# Patient Record
Sex: Female | Born: 1940
Health system: Southern US, Community
[De-identification: ages and names within clinical notes are randomized; demographics above are authoritative.]

## PROBLEM LIST (undated history)

## (undated) DIAGNOSIS — E119 Type 2 diabetes mellitus without complications: Secondary | ICD-10-CM

## (undated) DIAGNOSIS — E785 Hyperlipidemia, unspecified: Secondary | ICD-10-CM

## (undated) DIAGNOSIS — S92353A Displaced fracture of fifth metatarsal bone, unspecified foot, initial encounter for closed fracture: Secondary | ICD-10-CM

## (undated) DIAGNOSIS — I10 Essential (primary) hypertension: Secondary | ICD-10-CM

## (undated) DIAGNOSIS — G4700 Insomnia, unspecified: Secondary | ICD-10-CM

## (undated) HISTORY — DX: Hyperlipidemia, unspecified: E78.5

## (undated) HISTORY — DX: Type 2 diabetes mellitus without complications: E11.9

## (undated) HISTORY — DX: Essential (primary) hypertension: I10

## (undated) HISTORY — DX: Displaced fracture of fifth metatarsal bone, unspecified foot, initial encounter for closed fracture: S92.353A

## (undated) HISTORY — DX: Insomnia, unspecified: G47.00

---

## 2011-07-02 ENCOUNTER — Encounter: Payer: Self-pay | Admitting: Family Medicine

## 2011-07-02 DIAGNOSIS — Z8639 Personal history of other endocrine, nutritional and metabolic disease: Secondary | ICD-10-CM | POA: Insufficient documentation

## 2011-07-02 DIAGNOSIS — I10 Essential (primary) hypertension: Secondary | ICD-10-CM | POA: Insufficient documentation

## 2011-07-02 DIAGNOSIS — E785 Hyperlipidemia, unspecified: Secondary | ICD-10-CM

## 2011-07-03 ENCOUNTER — Encounter: Payer: Self-pay | Admitting: Family Medicine

## 2011-07-03 ENCOUNTER — Ambulatory Visit (INDEPENDENT_AMBULATORY_CARE_PROVIDER_SITE_OTHER): Payer: Medicare Other | Admitting: Family Medicine

## 2011-07-03 VITALS — BP 114/75 | HR 66 | Temp 98.4°F | Ht 60.0 in | Wt 147.0 lb

## 2011-07-03 DIAGNOSIS — S92919A Unspecified fracture of unspecified toe(s), initial encounter for closed fracture: Secondary | ICD-10-CM

## 2011-07-03 DIAGNOSIS — S92911A Unspecified fracture of right toe(s), initial encounter for closed fracture: Secondary | ICD-10-CM

## 2011-07-03 NOTE — Patient Instructions (Signed)
You have a great toe fracture. This should heal up well in 4-6 weeks with conservative care. Wear the boot or post-op shoe when you're up and walking around at all times. Ok to take off to ice your toe, to bathe (as long as you don't put weight on the ball of your foot or toe - be careful!), and to sleep if it feels comfortable enough. Elevate above the level of your heart to help with swelling. Ice 15 minutes at a time up to 3-4 times a day for pain and swelling. Follow up with me in 2 weeks for a recheck and to repeat x-rays and make sure this hasn't moved.

## 2011-07-04 ENCOUNTER — Encounter: Payer: Self-pay | Admitting: Family Medicine

## 2011-07-04 DIAGNOSIS — S92911A Unspecified fracture of right toe(s), initial encounter for closed fracture: Secondary | ICD-10-CM | POA: Insufficient documentation

## 2011-07-04 NOTE — Assessment & Plan Note (Signed)
Nondisplaced distal phalanx fracture of great toe - should heal well with conservative treatment over next 4-6 weeks.  Given this is the great toe, will have to repeat x-rays at 2 week intervals until clinically healed.  Continue icing, elevation, tylenol as needed for pain.  She did not feel comfortable in the postop shoe she has - feels too loose though tried to tighten this.  We tried her in walking boots and our postop shoe - she felt our postop shoe felt better and fracture site with less pain on ambulation in this.  F/u in 2 weeks for repeat eval and x-rays.

## 2011-07-04 NOTE — Progress Notes (Signed)
Subjective:    Patient ID: Diana Garcia, female    DOB: 1941-04-12, 70 y.o.   MRN: [id number]  PCP: Dr. Modesto Charon  HPI 70 yo F here with right great toe fracture.  Patient reports on 7/30 she was walking around at home when she caught foot in a plastic bag and fell forward causing injury to right great toe. Had immediate swelling and bruising, difficulty bearing weight. She saw her PCP on 8/1, had x-rays showing a nondisplaced metaphyseal fracture at base of distal phalanx of great toe. She was placed in a post-op shoe, advised to ice and elevate and referred here for further recommendations. Has h/o 5th MT fracture of this foot that occurred in March 2012.  Past Medical History  Diagnosis Date  . Closed fracture of fifth metatarsal bone   . Hyperlipidemia   . Hypertension   . Insomnia     Current Outpatient Prescriptions on File Prior to Visit  Medication Sig Dispense Refill  . amLODipine (NORVASC) 10 MG tablet Take 10 mg by mouth daily.        . Cholecalciferol (VITAMIN D PO) Take 2,000 Int'l Units by mouth daily.        . pravastatin (PRAVACHOL) 40 MG tablet Take 40 mg by mouth at bedtime.          History reviewed. No pertinent past surgical history.  Allergies  Allergen Reactions  . Penicillins     History   Social History  . Marital Status: Married    Spouse Name: N/A    Number of Children: N/A  . Years of Education: N/A   Occupational History  . Not on file.   Social History Main Topics  . Smoking status: Never Smoker   . Smokeless tobacco: Not on file  . Alcohol Use: Not on file  . Drug Use: Not on file  . Sexually Active: Not on file   Other Topics Concern  . Not on file   Social History Narrative  . No narrative on file    Family History  Problem Relation Age of Onset  . Hyperlipidemia Mother   . Hypertension Mother   . Diabetes Father   . Heart attack Father   . Diabetes Sister   . Hypertension Sister   . Diabetes Brother   . Sudden death  Neg Hx     BP 114/75  Pulse 66  Temp(Src) 98.4 F (36.9 C) (Oral)  Ht 5' (1.524 m)  Wt 147 lb (66.679 kg)  BMI 28.71 kg/m2  Review of Systems See HPI above.    Objective:   Physical Exam Gen: NAD  R foot: Bruising and swelling throughout great toe.  No bruising apparent under toenail - wearing polish through 3/4 of nail.  No breaks in skin. TTP distal phalanx of great toe.  No proximal phalanx, foot TTP otherwise. FROM ankle and other toes.  Did not test extent of great toe IP ROM - can flex and extend at MTP. Cap refill < 2 sec great toe.     Assessment & Plan:  1. Nondisplaced distal phalanx fracture of great toe - should heal well with conservative treatment over next 4-6 weeks.  Given this is the great toe, will have to repeat x-rays at 2 week intervals until clinically healed.  Continue icing, elevation, tylenol as needed for pain.  She did not feel comfortable in the postop shoe she has - feels too loose though tried to tighten this.  We tried her in walking  boots and our postop shoe - she felt our postop shoe felt better and fracture site with less pain on ambulation in this.  F/u in 2 weeks for repeat eval and x-rays.

## 2011-07-09 ENCOUNTER — Other Ambulatory Visit: Payer: Self-pay | Admitting: Family Medicine

## 2011-07-09 DIAGNOSIS — Z780 Asymptomatic menopausal state: Secondary | ICD-10-CM

## 2011-07-10 ENCOUNTER — Other Ambulatory Visit: Payer: Self-pay | Admitting: Family Medicine

## 2011-07-10 DIAGNOSIS — Z1231 Encounter for screening mammogram for malignant neoplasm of breast: Secondary | ICD-10-CM

## 2011-07-16 ENCOUNTER — Other Ambulatory Visit: Payer: PRIVATE HEALTH INSURANCE

## 2011-07-17 ENCOUNTER — Encounter: Payer: Self-pay | Admitting: Family Medicine

## 2011-07-17 ENCOUNTER — Ambulatory Visit (INDEPENDENT_AMBULATORY_CARE_PROVIDER_SITE_OTHER): Payer: Medicare Other | Admitting: Family Medicine

## 2011-07-17 ENCOUNTER — Ambulatory Visit (HOSPITAL_BASED_OUTPATIENT_CLINIC_OR_DEPARTMENT_OTHER)
Admission: RE | Admit: 2011-07-17 | Discharge: 2011-07-17 | Disposition: A | Discharge status: Home / Self Care | Payer: Medicare Other | Source: Ambulatory Visit | Attending: Family Medicine | Admitting: Family Medicine

## 2011-07-17 VITALS — BP 122/77 | HR 59

## 2011-07-17 DIAGNOSIS — M79676 Pain in unspecified toe(s): Secondary | ICD-10-CM

## 2011-07-17 DIAGNOSIS — S92919A Unspecified fracture of unspecified toe(s), initial encounter for closed fracture: Secondary | ICD-10-CM

## 2011-07-17 DIAGNOSIS — M79609 Pain in unspecified limb: Secondary | ICD-10-CM

## 2011-07-17 DIAGNOSIS — S92911A Unspecified fracture of right toe(s), initial encounter for closed fracture: Secondary | ICD-10-CM

## 2011-07-17 DIAGNOSIS — IMO0001 Reserved for inherently not codable concepts without codable children: Secondary | ICD-10-CM

## 2011-07-17 DIAGNOSIS — Z4789 Encounter for other orthopedic aftercare: Secondary | ICD-10-CM | POA: Insufficient documentation

## 2011-07-17 NOTE — Progress Notes (Signed)
Subjective:    Patient ID: Diana Garcia, female    DOB: 1941/03/08, 70 y.o.   MRN: [id number]  PCP: Dr. Modesto Charon  HPI  70 yo F here with right great toe fracture.  8/3: Patient reports on 7/30 she was walking around at home when she caught foot in a plastic bag and fell forward causing injury to right great toe. Had immediate swelling and bruising, difficulty bearing weight. She saw her PCP on 8/1, had x-rays showing a nondisplaced metaphyseal fracture at base of distal phalanx of great toe. She was placed in a post-op shoe, advised to ice and elevate and referred here for further recommendations. Has h/o 5th MT fracture of this foot that occurred in March 2012.  8/17: Patient reports pain is much improved, now very little pain in great toe. Not taking any medications. Using post-op shoe regularly. No other complaints.  Past Medical History  Diagnosis Date  . Closed fracture of fifth metatarsal bone   . Hyperlipidemia   . Hypertension   . Insomnia     Current Outpatient Prescriptions on File Prior to Visit  Medication Sig Dispense Refill  . amLODipine (NORVASC) 10 MG tablet Take 10 mg by mouth daily.        . Cholecalciferol (VITAMIN D PO) Take 2,000 Int'l Units by mouth daily.        . furosemide (LASIX) 40 MG tablet       . pravastatin (PRAVACHOL) 40 MG tablet Take 40 mg by mouth at bedtime.        Marland Kitchen zolpidem (AMBIEN) 5 MG tablet         History reviewed. No pertinent past surgical history.  Allergies  Allergen Reactions  . Penicillins     History   Social History  . Marital Status: Married    Spouse Name: N/A    Number of Children: N/A  . Years of Education: N/A   Occupational History  . Not on file.   Social History Main Topics  . Smoking status: Never Smoker   . Smokeless tobacco: Not on file  . Alcohol Use: Not on file  . Drug Use: Not on file  . Sexually Active: Not on file   Other Topics Concern  . Not on file   Social History Narrative  . No  narrative on file    Family History  Problem Relation Age of Onset  . Hyperlipidemia Mother   . Hypertension Mother   . Diabetes Father   . Heart attack Father   . Diabetes Sister   . Hypertension Sister   . Diabetes Brother   . Sudden death Neg Hx     BP 122/77  Pulse 59  Review of Systems  See HPI above.    Objective:   Physical Exam  Gen: NAD  R foot: Only mild swelling throughout great toe.  No bruising, no subungual hematoma Mild TTP distal phalanx of great toe.  No proximal phalanx, foot TTP otherwise. FROM ankle and other toes.  Did not test extent of great toe IP ROM - can flex and extend at MTP. Cap refill < 2 sec great toe.     Assessment & Plan:  1. Nondisplaced distal phalanx fracture of great toe - X-rays show no displacement.  Clinically healing as expected.  Will f/u in 3 weeks for repeat exam.  If still tender will repeat x-rays at that time.  Tylenol, icing as needed.  Continue postop shoe.  Call with any concerns in meantime.

## 2011-07-17 NOTE — Patient Instructions (Signed)
Your x-rays look great. Continue wearing the post-op shoe when up and walking around. Icing, tylenol as needed for pain. This will be completely healed by 4 weeks from now. Follow up with me in 2-3 weeks for a recheck - if still sore, we may repeat x-rays. Call with any questions or concerns.

## 2011-07-17 NOTE — Assessment & Plan Note (Signed)
Nondisplaced distal phalanx fracture of great toe - X-rays show no displacement.  Clinically healing as expected.  Will f/u in 3 weeks for repeat exam.  If still tender will repeat x-rays at that time.  Tylenol, icing as needed.  Continue postop shoe.  Call with any concerns in meantime.

## 2011-08-06 ENCOUNTER — Ambulatory Visit (HOSPITAL_BASED_OUTPATIENT_CLINIC_OR_DEPARTMENT_OTHER)
Admission: RE | Admit: 2011-08-06 | Discharge: 2011-08-06 | Disposition: A | Discharge status: Home / Self Care | Payer: Medicare Other | Source: Ambulatory Visit | Attending: Family Medicine | Admitting: Family Medicine

## 2011-08-06 ENCOUNTER — Ambulatory Visit (INDEPENDENT_AMBULATORY_CARE_PROVIDER_SITE_OTHER): Payer: PRIVATE HEALTH INSURANCE | Admitting: Family Medicine

## 2011-08-06 ENCOUNTER — Encounter: Payer: Self-pay | Admitting: Family Medicine

## 2011-08-06 VITALS — BP 115/75

## 2011-08-06 DIAGNOSIS — X58XXXA Exposure to other specified factors, initial encounter: Secondary | ICD-10-CM | POA: Insufficient documentation

## 2011-08-06 DIAGNOSIS — S92919A Unspecified fracture of unspecified toe(s), initial encounter for closed fracture: Secondary | ICD-10-CM

## 2011-08-06 DIAGNOSIS — M79671 Pain in right foot: Secondary | ICD-10-CM

## 2011-08-06 DIAGNOSIS — S92911A Unspecified fracture of right toe(s), initial encounter for closed fracture: Secondary | ICD-10-CM

## 2011-08-06 DIAGNOSIS — M79609 Pain in unspecified limb: Secondary | ICD-10-CM | POA: Insufficient documentation

## 2011-08-06 NOTE — Patient Instructions (Signed)
Your x-rays show this fracture on your 5th metatarsal has healed though you do have mild arthritis here. Wear the shoe for 2 more weeks then you can stop using it. Ice, naprosyn as needed. Follow up with me as needed.

## 2011-08-06 NOTE — Progress Notes (Signed)
Subjective:    Patient ID: Diana Garcia, female    DOB: 1941-09-01, 70 y.o.   MRN: [id number]  PCP: Dr. Modesto Charon  Foot Pain   71 yo F here with right great toe fracture.  8/3: Patient reports on 7/30 she was walking around at home when she caught foot in a plastic bag and fell forward causing injury to right great toe. Had immediate swelling and bruising, difficulty bearing weight. She saw her PCP on 8/1, had x-rays showing a nondisplaced metaphyseal fracture at base of distal phalanx of great toe. She was placed in a post-op shoe, advised to ice and elevate and referred here for further recommendations. Has h/o 5th MT fracture of this foot that occurred in March 2012.  8/17: Patient reports pain is much improved, now very little pain in great toe. Not taking any medications. Using post-op shoe regularly. No other complaints.  9/6: Patient doing better still - 40% improved compared to last visit. Only has pain at extents of full motion of great toe. No pain when she pushes on area. Did take naprosyn the other day at bedtime because it was more sore. Using postop shoe regularly. Outside of right foot hurts occasionally - she had a fracture here previously but was told it would heal well and didn't need follow-up. Not icing - is elevating.  Past Medical History  Diagnosis Date  . Closed fracture of fifth metatarsal bone   . Hyperlipidemia   . Hypertension   . Insomnia     Current Outpatient Prescriptions on File Prior to Visit  Medication Sig Dispense Refill  . amLODipine (NORVASC) 10 MG tablet Take 10 mg by mouth daily.        . Cholecalciferol (VITAMIN D PO) Take 2,000 Int'l Units by mouth daily.        . furosemide (LASIX) 40 MG tablet       . pravastatin (PRAVACHOL) 40 MG tablet Take 40 mg by mouth at bedtime.        Marland Kitchen zolpidem (AMBIEN) 5 MG tablet         History reviewed. No pertinent past surgical history.  Allergies  Allergen Reactions  . Penicillins      History   Social History  . Marital Status: Married    Spouse Name: N/A    Number of Children: N/A  . Years of Education: N/A   Occupational History  . Not on file.   Social History Main Topics  . Smoking status: Never Smoker   . Smokeless tobacco: Not on file  . Alcohol Use: Not on file  . Drug Use: Not on file  . Sexually Active: Not on file   Other Topics Concern  . Not on file   Social History Narrative  . No narrative on file    Family History  Problem Relation Age of Onset  . Hyperlipidemia Mother   . Hypertension Mother   . Diabetes Father   . Heart attack Father   . Diabetes Sister   . Hypertension Sister   . Diabetes Brother   . Sudden death Neg Hx     BP 115/75  Review of Systems  See HPI above.    Objective:   Physical Exam Gen: NAD  R foot: No swelling, bruising, no subungual hematoma No TTP distal phalanx of great toe.  Mild TTP just proximal to 5th MT base.  No focal bony TTP.  No other foot TTP otherwise. FROM ankle, toes including MTP and IP great  toe. Negative ant drawer and talar tilt. Cap refill < 2 sec great toe.    Assessment & Plan:  1. Nondisplaced distal phalanx fracture of great toe - X-rays show no displacement - fracture lines visible though clinically now has no pain.  Discussed radiographs can lag behind clinical healing by 2-4 weeks and this fracture generally does very well.  Continue postop shoe, icing, elevation for next 2 weeks then postop shoe as needed.  F/u prn or if not improving as expected over next 2-4 weeks.  Also noted her lateral foot pain, though not at 5th MT currently, may be due to arthritis near site of avulsion fracture she had of base 5th.  Fracture itself appears healed though.

## 2011-08-06 NOTE — Assessment & Plan Note (Signed)
Nondisplaced distal phalanx fracture of great toe - X-rays show no displacement - fracture lines visible though clinically now has no pain.  Discussed radiographs can lag behind clinical healing by 2-4 weeks and this fracture generally does very well.  Continue postop shoe, icing, elevation for next 2 weeks then postop shoe as needed.  F/u prn or if not improving as expected over next 2-4 weeks.  Also noted her lateral foot pain, though not at 5th MT currently, may be due to arthritis near site of avulsion fracture she had of base 5th.  Fracture itself appears healed though.

## 2011-08-07 ENCOUNTER — Ambulatory Visit: Payer: PRIVATE HEALTH INSURANCE | Admitting: Family Medicine

## 2011-08-25 ENCOUNTER — Ambulatory Visit
Admission: RE | Admit: 2011-08-25 | Discharge: 2011-08-25 | Disposition: A | Discharge status: Home / Self Care | Payer: PRIVATE HEALTH INSURANCE | Source: Ambulatory Visit | Attending: Family Medicine | Admitting: Family Medicine

## 2011-08-25 ENCOUNTER — Ambulatory Visit
Admission: RE | Admit: 2011-08-25 | Discharge: 2011-08-25 | Disposition: A | Discharge status: Home / Self Care | Payer: Medicare Other | Source: Ambulatory Visit | Attending: Family Medicine | Admitting: Family Medicine

## 2011-08-25 DIAGNOSIS — Z1231 Encounter for screening mammogram for malignant neoplasm of breast: Secondary | ICD-10-CM

## 2011-08-25 DIAGNOSIS — Z780 Asymptomatic menopausal state: Secondary | ICD-10-CM

## 2012-01-13 DIAGNOSIS — R7989 Other specified abnormal findings of blood chemistry: Secondary | ICD-10-CM | POA: Diagnosis not present

## 2012-01-13 DIAGNOSIS — I10 Essential (primary) hypertension: Secondary | ICD-10-CM | POA: Diagnosis not present

## 2012-01-13 DIAGNOSIS — E559 Vitamin D deficiency, unspecified: Secondary | ICD-10-CM | POA: Diagnosis not present

## 2012-01-13 DIAGNOSIS — E785 Hyperlipidemia, unspecified: Secondary | ICD-10-CM | POA: Diagnosis not present

## 2012-01-18 DIAGNOSIS — N900 Mild vulvar dysplasia: Secondary | ICD-10-CM | POA: Diagnosis not present

## 2012-04-20 ENCOUNTER — Ambulatory Visit (HOSPITAL_COMMUNITY)
Admission: RE | Admit: 2012-04-20 | Discharge: 2012-04-20 | Disposition: A | Discharge status: Home / Self Care | Payer: Medicare Other | Source: Ambulatory Visit | Attending: Family Medicine | Admitting: Family Medicine

## 2012-04-20 DIAGNOSIS — E7800 Pure hypercholesterolemia, unspecified: Secondary | ICD-10-CM | POA: Diagnosis not present

## 2012-04-20 DIAGNOSIS — R52 Pain, unspecified: Secondary | ICD-10-CM

## 2012-04-20 DIAGNOSIS — M79609 Pain in unspecified limb: Secondary | ICD-10-CM | POA: Diagnosis not present

## 2012-04-20 DIAGNOSIS — E785 Hyperlipidemia, unspecified: Secondary | ICD-10-CM | POA: Diagnosis not present

## 2012-04-20 DIAGNOSIS — IMO0001 Reserved for inherently not codable concepts without codable children: Secondary | ICD-10-CM | POA: Diagnosis not present

## 2012-04-20 DIAGNOSIS — E559 Vitamin D deficiency, unspecified: Secondary | ICD-10-CM | POA: Diagnosis not present

## 2012-04-20 DIAGNOSIS — R9431 Abnormal electrocardiogram [ECG] [EKG]: Secondary | ICD-10-CM | POA: Diagnosis not present

## 2012-04-20 DIAGNOSIS — I10 Essential (primary) hypertension: Secondary | ICD-10-CM | POA: Diagnosis not present

## 2012-04-20 DIAGNOSIS — I70219 Atherosclerosis of native arteries of extremities with intermittent claudication, unspecified extremity: Secondary | ICD-10-CM | POA: Diagnosis not present

## 2012-04-20 DIAGNOSIS — R7989 Other specified abnormal findings of blood chemistry: Secondary | ICD-10-CM | POA: Diagnosis not present

## 2012-04-20 DIAGNOSIS — M7989 Other specified soft tissue disorders: Secondary | ICD-10-CM | POA: Diagnosis not present

## 2012-04-20 NOTE — Progress Notes (Signed)
Bilateral lower extremity venous duplex completed.  Preliminary report is negative for DVT, SVT, or a Baker's cyst. 

## 2012-04-21 ENCOUNTER — Ambulatory Visit (HOSPITAL_COMMUNITY)
Admission: RE | Admit: 2012-04-21 | Discharge: 2012-04-21 | Disposition: A | Discharge status: Home / Self Care | Payer: Medicare Other | Source: Ambulatory Visit | Attending: Family Medicine | Admitting: Family Medicine

## 2012-04-21 DIAGNOSIS — M79609 Pain in unspecified limb: Secondary | ICD-10-CM | POA: Diagnosis not present

## 2012-04-21 DIAGNOSIS — M79606 Pain in leg, unspecified: Secondary | ICD-10-CM

## 2012-04-21 DIAGNOSIS — I739 Peripheral vascular disease, unspecified: Secondary | ICD-10-CM | POA: Diagnosis not present

## 2012-04-21 DIAGNOSIS — I70219 Atherosclerosis of native arteries of extremities with intermittent claudication, unspecified extremity: Secondary | ICD-10-CM | POA: Insufficient documentation

## 2012-04-21 DIAGNOSIS — R0989 Other specified symptoms and signs involving the circulatory and respiratory systems: Secondary | ICD-10-CM | POA: Diagnosis not present

## 2012-04-21 NOTE — Progress Notes (Signed)
VASCULAR LAB PRELIMINARY  ARTERIAL  ABI completed:    RIGHT    LEFT    PRESSURE WAVEFORM  PRESSURE WAVEFORM  BRACHIAL 142  triphasic BRACHIAL 146 triphasic  DP 164 triphasic DP 67 monophasic  AT   AT    PT 136 triphasic PT 91 monophasic  PER   PER    GREAT TOE  NA GREAT TOE  NA    RIGHT LEFT  ABI 1.12 0.62   Duplex imaging reveals significant stenosis in the left mid to distal femoral artery.  Terance Hart, RVT 04/21/2012, 12:42 PM

## 2012-04-22 DIAGNOSIS — I10 Essential (primary) hypertension: Secondary | ICD-10-CM | POA: Diagnosis not present

## 2012-04-22 DIAGNOSIS — E7800 Pure hypercholesterolemia, unspecified: Secondary | ICD-10-CM | POA: Diagnosis not present

## 2012-04-22 DIAGNOSIS — R9431 Abnormal electrocardiogram [ECG] [EKG]: Secondary | ICD-10-CM | POA: Diagnosis not present

## 2012-04-27 DIAGNOSIS — R9431 Abnormal electrocardiogram [ECG] [EKG]: Secondary | ICD-10-CM | POA: Diagnosis not present

## 2012-04-27 DIAGNOSIS — I10 Essential (primary) hypertension: Secondary | ICD-10-CM | POA: Diagnosis not present

## 2012-05-04 DIAGNOSIS — R9431 Abnormal electrocardiogram [ECG] [EKG]: Secondary | ICD-10-CM | POA: Diagnosis not present

## 2012-05-04 DIAGNOSIS — E7800 Pure hypercholesterolemia, unspecified: Secondary | ICD-10-CM | POA: Diagnosis not present

## 2012-05-04 DIAGNOSIS — I70219 Atherosclerosis of native arteries of extremities with intermittent claudication, unspecified extremity: Secondary | ICD-10-CM | POA: Diagnosis not present

## 2012-05-04 DIAGNOSIS — I10 Essential (primary) hypertension: Secondary | ICD-10-CM | POA: Diagnosis not present

## 2012-05-05 ENCOUNTER — Encounter (HOSPITAL_COMMUNITY): Payer: Self-pay | Admitting: Pharmacy Technician

## 2012-05-10 DIAGNOSIS — I739 Peripheral vascular disease, unspecified: Secondary | ICD-10-CM | POA: Diagnosis not present

## 2012-05-10 DIAGNOSIS — I70219 Atherosclerosis of native arteries of extremities with intermittent claudication, unspecified extremity: Secondary | ICD-10-CM | POA: Diagnosis not present

## 2012-05-10 DIAGNOSIS — Z01810 Encounter for preprocedural cardiovascular examination: Secondary | ICD-10-CM | POA: Diagnosis not present

## 2012-05-16 ENCOUNTER — Observation Stay (HOSPITAL_COMMUNITY)
Admission: RE | Admit: 2012-05-16 | Discharge: 2012-05-17 | Disposition: A | Discharge status: Home / Self Care | Payer: Medicare Other | Source: Ambulatory Visit | Attending: Cardiology | Admitting: Cardiology

## 2012-05-16 ENCOUNTER — Encounter (HOSPITAL_COMMUNITY)
Admission: RE | Disposition: A | Discharge status: Home / Self Care | Payer: Self-pay | Source: Ambulatory Visit | Attending: Cardiology

## 2012-05-16 DIAGNOSIS — E785 Hyperlipidemia, unspecified: Secondary | ICD-10-CM | POA: Diagnosis not present

## 2012-05-16 DIAGNOSIS — I739 Peripheral vascular disease, unspecified: Secondary | ICD-10-CM | POA: Diagnosis present

## 2012-05-16 DIAGNOSIS — I70219 Atherosclerosis of native arteries of extremities with intermittent claudication, unspecified extremity: Principal | ICD-10-CM | POA: Insufficient documentation

## 2012-05-16 DIAGNOSIS — R9431 Abnormal electrocardiogram [ECG] [EKG]: Secondary | ICD-10-CM | POA: Diagnosis not present

## 2012-05-16 DIAGNOSIS — I10 Essential (primary) hypertension: Secondary | ICD-10-CM | POA: Insufficient documentation

## 2012-05-16 HISTORY — PX: LOWER EXTREMITY ANGIOGRAM: SHX5508

## 2012-05-16 LAB — POCT ACTIVATED CLOTTING TIME: Activated Clotting Time: 244 seconds

## 2012-05-16 LAB — POCT I-STAT, CHEM 8
Creatinine, Ser: 0.7 mg/dL (ref 0.50–1.10)
Glucose, Bld: 132 mg/dL — ABNORMAL HIGH (ref 70–99)
Hemoglobin: 12.9 g/dL (ref 12.0–15.0)
Potassium: 4.2 mEq/L (ref 3.5–5.1)

## 2012-05-16 LAB — GLUCOSE, CAPILLARY: Glucose-Capillary: 154 mg/dL — ABNORMAL HIGH (ref 70–99)

## 2012-05-16 SURGERY — ANGIOGRAM, LOWER EXTREMITY
Anesthesia: LOCAL

## 2012-05-16 MED ORDER — ATROPINE SULFATE 1 MG/ML IJ SOLN
INTRAMUSCULAR | Status: AC
  Filled 2012-05-16: qty 1

## 2012-05-16 MED ORDER — CARVEDILOL 3.125 MG PO TABS
3.1250 mg | ORAL_TABLET | Freq: Two times a day (BID) | ORAL | Status: DC
  Administered 2012-05-16: 3.125 mg via ORAL
  Filled 2012-05-16 (×4): qty 1

## 2012-05-16 MED ORDER — NITROGLYCERIN 0.2 MG/ML ON CALL CATH LAB
INTRAVENOUS | Status: AC
  Filled 2012-05-16: qty 1

## 2012-05-16 MED ORDER — HYDROMORPHONE HCL PF 2 MG/ML IJ SOLN
INTRAMUSCULAR | Status: AC
  Filled 2012-05-16: qty 1

## 2012-05-16 MED ORDER — ONDANSETRON HCL 4 MG/2ML IJ SOLN: 4.0000 mg | Freq: Four times a day (QID) | INTRAMUSCULAR | Status: DC | PRN

## 2012-05-16 MED ORDER — LIDOCAINE HCL (PF) 1 % IJ SOLN
3.0000 mL | Freq: Once | INTRAMUSCULAR | Status: AC
  Administered 2012-05-16: 1 mL

## 2012-05-16 MED ORDER — MIDAZOLAM HCL 2 MG/2ML IJ SOLN
INTRAMUSCULAR | Status: AC
  Filled 2012-05-16: qty 2

## 2012-05-16 MED ORDER — SODIUM CHLORIDE 0.9 % IV SOLN: 1.0000 mL/kg/h | INTRAVENOUS | Status: DC

## 2012-05-16 MED ORDER — ASPIRIN EC 81 MG PO TBEC
81.0000 mg | DELAYED_RELEASE_TABLET | Freq: Every day | ORAL | Status: DC
  Administered 2012-05-17: 81 mg via ORAL
  Filled 2012-05-16 (×2): qty 1

## 2012-05-16 MED ORDER — ACETAMINOPHEN 325 MG PO TABS
650.0000 mg | ORAL_TABLET | ORAL | Status: DC | PRN
  Filled 2012-05-16: qty 2

## 2012-05-16 MED ORDER — HEPARIN SODIUM (PORCINE) 1000 UNIT/ML IJ SOLN
INTRAMUSCULAR | Status: AC
  Filled 2012-05-16: qty 1

## 2012-05-16 MED ORDER — HEPARIN (PORCINE) IN NACL 2-0.9 UNIT/ML-% IJ SOLN
INTRAMUSCULAR | Status: AC
  Filled 2012-05-16: qty 2000

## 2012-05-16 MED ORDER — LISINOPRIL 10 MG PO TABS
10.0000 mg | ORAL_TABLET | Freq: Every day | ORAL | Status: DC
  Administered 2012-05-16: 10 mg via ORAL
  Filled 2012-05-16 (×2): qty 1

## 2012-05-16 MED ORDER — CLOPIDOGREL BISULFATE 75 MG PO TABS
75.0000 mg | ORAL_TABLET | Freq: Every day | ORAL | Status: DC
  Administered 2012-05-17: 75 mg via ORAL
  Filled 2012-05-16: qty 1

## 2012-05-16 MED ORDER — ACETAMINOPHEN 325 MG PO TABS
650.0000 mg | ORAL_TABLET | ORAL | Status: DC | PRN
  Administered 2012-05-17: 650 mg via ORAL

## 2012-05-16 MED ORDER — SODIUM CHLORIDE 0.9 % IV SOLN
INTRAVENOUS | Status: DC
  Administered 2012-05-16: 06:00:00 via INTRAVENOUS

## 2012-05-16 MED ORDER — LIDOCAINE-EPINEPHRINE 1 %-1:100000 IJ SOLN
INTRAMUSCULAR | Status: AC
  Filled 2012-05-16: qty 1

## 2012-05-16 MED ORDER — EZETIMIBE-SIMVASTATIN 10-40 MG PO TABS
1.0000 | ORAL_TABLET | Freq: Every day | ORAL | Status: DC
  Administered 2012-05-16: 1 via ORAL
  Filled 2012-05-16 (×2): qty 1

## 2012-05-16 MED ORDER — AMLODIPINE BESYLATE 10 MG PO TABS
10.0000 mg | ORAL_TABLET | Freq: Every day | ORAL | Status: DC
  Administered 2012-05-17: 10 mg via ORAL
  Filled 2012-05-16: qty 1

## 2012-05-16 MED ORDER — LIDOCAINE HCL (PF) 1 % IJ SOLN
INTRAMUSCULAR | Status: AC
  Filled 2012-05-16: qty 30

## 2012-05-16 NOTE — Progress Notes (Signed)
C/O RIGHT GROIN PAIN AND STATES IT HAD NOT BEEN HURTING; AREA FIRMNESS NOTED LATERALLY TO GROIN STICK AND DRESSING REMOVED AND OOZING NOTED RIGHT GROIN AND DR Jacinto Halim NOTIFIED AND ORDER NOTED

## 2012-05-16 NOTE — CV Procedure (Addendum)
Procedures performed: Femoral access  Abdominal aortogram. Abdominal aortogram and crossover from right into the left femoral artery placement of catheter tip in the left femoral artery and left femoral arteriogram with distal runoff  Indication: Is a 72 year old African American female with history of hypertension and hyperlipidemia who presents with severe lifestyle limiting claudication of the left leg. Outpatient Doppler evaluation had revealed high-grade stenosis of the left superficial femoral artery. She is now brought to the peripheral angiography suite for evaluation of her peripheral anatomy.  Peripheral arthrogram: No evidence of abdominal aneurysm. 2 renal arteries one on either side and they're widely patent. The right renal artery has a inferior origin. Aortoiliac bifurcation was widely patent.   Left femoral arteriogram: Normal femoral arteries and mild disease in the proximal left SFA constituting 20-30% stenoses. Midsegment of the left superficial femoral artery showed high-grade 99% stenosis. Slow filling of the peripheral vessels. There was two-vessel runoff in the form of peroneal and posterior tibial artery. Distal anterior tibial artery is occluded and is collateralized.  Interventional data: Successful angiosculpt angioscore balloon angioplasty of the left mid superficial femoral artery stenoses. A 6.0 x 40 mm angioscore balloon was utilized to perform a total of 3 balloon inflation and 4-6 atmospheric pressure for 90 seconds each. Intra-arterial 200 mcg x2 of nitroglycerin also administered during the procedure. The stenosis was reduced from 99% and with brisk flow established distally below the knee. Excellent pulses were evident postangioplasty.  IMPRESSIONS: Single lesion in the left mid superficial femoral artery of 99% reduced to 0% with scoring balloon angioplasty. Left anterior tibial artery is occluded just above the ankle but reconstitutes via collaterals. Excellent  dorsalis pedis pulse was also admitted post procedure.  Recommendation: Patient will be discharged home today after 7 hours of bedrest. She'll be in bedrest pretty much at home today and skin slowly resume her activities tomorrow.   TECHNICAL PROCEDURE: Under sterile precautions using a 5-French right femoral access a A 5 [place] Omniflush catheter was advanced via right femoral arterial access. The catheter was advanced into the desccending aorta and abdominal angiogram was performed. The same catheter was utilized to cross over from the right femoral artery into the left femoral artery with the help of a Versacore wire in the catheter was placed in the left external iliac artery. Left femoral arteriogram with distal runoff was performed.  Intravenous heparin was utilized and ACT was maintained and there is greater than 200. A 300 cm x 0.014"spartocore  Guidewire was utilized to cross the left superficial femoral artery stenoses. Angina score was performed using the above said balloon. Angiography was performed postprocedure. The lesion length was measured with an of radiopaque marker.  After the procedure the wire was withdrawn and a 7 [place] sheath that was exchanged from a 5 [place] sheath was then gently pulled back into the right common femoral artery and right femoral arteriogram was performed. Close proximity to the bifurcation of the common femoral artery we decided to do manual pressure. The angioplasty was performed via 7 French Ansel sheath. Patient told the procedure well. No immediate complications were evident. Abdominal aortogram: This was performed with the help of a 5 French Omni Flush catheter.

## 2012-05-16 NOTE — Discharge Instructions (Signed)
Yout will be discharged home today after 7 hours of bedrest. She'll be in bedrest pretty much at home today and skin slowly resume her activities tomorrow.Groin Site Care Refer to this sheet in the next few weeks. These instructions provide you with information on caring for yourself after your procedure. Your caregiver may also give you more specific instructions. Your treatment has been planned according to current medical practices, but problems sometimes occur. Call your caregiver if you have any problems or questions after your procedure. HOME CARE INSTRUCTIONS  You may shower 24 hours after the procedure. Remove the bandage (dressing) and gently wash the site with plain soap and water. Gently pat the site dry.   Do not apply powder or lotion to the site.   Do not sit in a bathtub, swimming pool, or whirlpool for 5 to 7 days.   No bending, squatting, or lifting anything over 10 pounds (4.5 kg) as directed by your caregiver.   Inspect the site at least twice daily.   Do not drive home if you are discharged the same day of the procedure. Have someone else drive you.   You may drive 24 hours after the procedure unless otherwise instructed by your caregiver.  What to expect:  Any bruising will usually fade within 1 to 2 weeks.   Blood that collects in the tissue (hematoma) may be painful to the touch. It should usually decrease in size and tenderness within 1 to 2 weeks.  SEEK IMMEDIATE MEDICAL CARE IF:  You have unusual pain at the groin site or down the affected leg.   You have redness, warmth, swelling, or pain at the groin site.   You have drainage (other than a small amount of blood on the dressing).   You have chills.   You have a fever or persistent symptoms for more than 72 hours.   You have a fever and your symptoms suddenly get worse.   Your leg becomes pale, cool, tingly, or numb.   You have heavy bleeding from the site. Hold pressure on the site.  Document  Released: 12/19/2010 Document Revised: 11/05/2011 Document Reviewed: 12/19/2010 [facility] Patient Information 2012 [employer], [place].

## 2012-05-16 NOTE — Interval H&P Note (Signed)
History and Physical Interval Note:  05/16/2012 7:42 AM  Diana Garcia  has presented today for surgery, with the diagnosis of claudication  The various methods of treatment have been discussed with the patient and family. After consideration of risks, benefits and other options for treatment, the patient has consented to  Procedure(s) (LRB): LOWER EXTREMITY ANGIOGRAM (N/A) and possible angioplasty as a surgical intervention .  The patients' history has been reviewed, patient examined, no change in status, stable for surgery.  I have reviewed the patients' chart and labs.  Questions were answered to the patient's satisfaction.     Pamella Pert

## 2012-05-16 NOTE — H&P (Signed)
  Please see paper chart  

## 2012-05-17 ENCOUNTER — Encounter (HOSPITAL_COMMUNITY): Payer: Self-pay | Admitting: *Deleted

## 2012-05-17 LAB — CBC
HCT: 36.3 % (ref 36.0–46.0)
MCHC: 31.4 g/dL (ref 30.0–36.0)
Platelets: 164 10*3/uL (ref 150–400)
RDW: 13.6 % (ref 11.5–15.5)
WBC: 7.8 10*3/uL (ref 4.0–10.5)

## 2012-05-17 LAB — COMPREHENSIVE METABOLIC PANEL
ALT: 13 U/L (ref 0–35)
Albumin: 3.6 g/dL (ref 3.5–5.2)
Alkaline Phosphatase: 66 U/L (ref 39–117)
Calcium: 9.3 mg/dL (ref 8.4–10.5)
GFR calc Af Amer: >90 mL/min (ref >90)
Glucose, Bld: 118 mg/dL — ABNORMAL HIGH (ref 70–99)
Potassium: 4.1 mEq/L (ref 3.5–5.1)
Sodium: 140 mEq/L (ref 135–145)
Total Protein: 6.5 g/dL (ref 6.0–8.3)

## 2012-05-17 MED ORDER — ZOLPIDEM TARTRATE 5 MG PO TABS
5.0000 mg | ORAL_TABLET | Freq: Every evening | ORAL | Status: DC | PRN
  Administered 2012-05-17: 5 mg via ORAL
  Filled 2012-05-17: qty 1

## 2012-05-17 NOTE — Progress Notes (Signed)
Utilization review complete 

## 2012-05-17 NOTE — Discharge Summary (Signed)
  Physician Discharge Summary  Patient ID: Diana Garcia MRN: [id number] DOB/AGE: 07/18/1941 71 y.o.  Admit date: 05/16/2012 Discharge date: 05/17/2012  Primary Discharge Diagnosis peripheral arterial disease status post PTA and scoring balloon angioplasty of the left superficial femoral artery. Stenosis reduced from 99% to 0%. Left anterior tibial artery is occluded but with excellent collateral filling at the level of the ankle.  Secondary Discharge Diagnosis Hypertension Hyperlipidemia  Significant Diagnostic Studies:  Consults:   Hospital Course: Patient was admitted to the hospital an elective fashion for peripheral angiography for  claudication. She did well during the procedure however post procedure she developed a small hematoma. Due to her advanced age, petite built, patient was kept in the hospital for overnight observation. The following morning the right groin site was soft without any significant hematoma or bruit. There was no tenderness. Hence patient was felt stable for discharge.  Discharge Exam: Blood pressure 99/57, pulse 60, temperature 97.7 F (36.5 C), temperature source Oral, resp. rate 19, height 5\' 2"  (1.575 m), weight 64.864 kg (143 lb), SpO2 99.00%.    General appearance: alert, appears stated age and no distress Resp: clear to auscultation bilaterally Cardio: regular rate and rhythm, S1, S2 normal, no murmur, click, rub or gallop Extremities: extremities normal, atraumatic, no cyanosis or edema and See above for groin site description Labs:   Lab Results  Component Value Date   WBC 7.8 05/17/2012   HGB 11.4* 05/17/2012   HCT 36.3 05/17/2012   MCV 96.5 05/17/2012   PLT 164 05/17/2012    Lab 05/17/12 0510  NA 140  K 4.1  CL 107  CO2 24  BUN 13  CREATININE 0.69  CALCIUM 9.3  PROT 6.5  BILITOT 0.3  ALKPHOS 66  ALT 13  AST 14  GLUCOSE 118*     WUJ:WJXB: NSR  FOLLOW UP PLANS AND APPOINTMENTS  Medication List  As of 05/17/2012  7:56 AM   TAKE  these medications         amLODipine 10 MG tablet   Commonly known as: NORVASC   Take 10 mg by mouth daily.      aspirin EC 81 MG tablet   Take 81 mg by mouth daily.      CALCIUM 600 + D PO   Take 1 tablet by mouth 3 (three) times a week. No specific days      carvedilol 3.125 MG tablet   Commonly known as: COREG   Take 3.125 mg by mouth 2 (two) times daily with a meal.      clopidogrel 75 MG tablet   Commonly known as: PLAVIX   Take 75 mg by mouth daily.      ezetimibe-simvastatin 10-40 MG per tablet   Commonly known as: VYTORIN   Take 1 tablet by mouth at bedtime.      lisinopril 10 MG tablet   Commonly known as: PRINIVIL,ZESTRIL   Take 10 mg by mouth daily.      VITAMIN D PO   Take 4,000 Units by mouth 3 (three) times a week. No specific days           Follow-up Information    Follow up with Pamella Pert, MD. (Keep appointment)    Contact information:   [address]  [place] [address] [phone]           Pamella Pert, MD 05/17/2012, 7:56 AM

## 2012-05-30 DIAGNOSIS — I739 Peripheral vascular disease, unspecified: Secondary | ICD-10-CM | POA: Diagnosis not present

## 2012-05-30 DIAGNOSIS — I70219 Atherosclerosis of native arteries of extremities with intermittent claudication, unspecified extremity: Secondary | ICD-10-CM | POA: Diagnosis not present

## 2012-06-08 DIAGNOSIS — E7800 Pure hypercholesterolemia, unspecified: Secondary | ICD-10-CM | POA: Diagnosis not present

## 2012-06-08 DIAGNOSIS — R7309 Other abnormal glucose: Secondary | ICD-10-CM | POA: Diagnosis not present

## 2012-06-08 DIAGNOSIS — I70219 Atherosclerosis of native arteries of extremities with intermittent claudication, unspecified extremity: Secondary | ICD-10-CM | POA: Diagnosis not present

## 2012-06-08 DIAGNOSIS — I10 Essential (primary) hypertension: Secondary | ICD-10-CM | POA: Diagnosis not present

## 2012-06-09 DIAGNOSIS — E119 Type 2 diabetes mellitus without complications: Secondary | ICD-10-CM | POA: Diagnosis not present

## 2012-06-09 DIAGNOSIS — I10 Essential (primary) hypertension: Secondary | ICD-10-CM | POA: Diagnosis not present

## 2012-07-28 DIAGNOSIS — I10 Essential (primary) hypertension: Secondary | ICD-10-CM | POA: Diagnosis not present

## 2012-07-28 DIAGNOSIS — IMO0001 Reserved for inherently not codable concepts without codable children: Secondary | ICD-10-CM | POA: Diagnosis not present

## 2012-08-04 DIAGNOSIS — E119 Type 2 diabetes mellitus without complications: Secondary | ICD-10-CM | POA: Diagnosis not present

## 2012-08-04 DIAGNOSIS — I10 Essential (primary) hypertension: Secondary | ICD-10-CM | POA: Diagnosis not present

## 2012-11-02 DIAGNOSIS — E119 Type 2 diabetes mellitus without complications: Secondary | ICD-10-CM | POA: Diagnosis not present

## 2012-11-02 DIAGNOSIS — E785 Hyperlipidemia, unspecified: Secondary | ICD-10-CM | POA: Diagnosis not present

## 2012-11-16 ENCOUNTER — Other Ambulatory Visit: Payer: Self-pay | Admitting: Family Medicine

## 2012-11-16 DIAGNOSIS — Z1231 Encounter for screening mammogram for malignant neoplasm of breast: Secondary | ICD-10-CM

## 2012-12-20 ENCOUNTER — Ambulatory Visit: Payer: Medicare Other

## 2012-12-26 DIAGNOSIS — I70219 Atherosclerosis of native arteries of extremities with intermittent claudication, unspecified extremity: Secondary | ICD-10-CM | POA: Diagnosis not present

## 2012-12-27 ENCOUNTER — Ambulatory Visit
Admission: RE | Admit: 2012-12-27 | Discharge: 2012-12-27 | Disposition: A | Discharge status: Home / Self Care | Payer: Medicare Other | Source: Ambulatory Visit | Attending: Family Medicine | Admitting: Family Medicine

## 2012-12-27 DIAGNOSIS — Z1231 Encounter for screening mammogram for malignant neoplasm of breast: Secondary | ICD-10-CM

## 2013-03-16 ENCOUNTER — Encounter: Payer: Self-pay | Admitting: Family Medicine

## 2013-03-16 ENCOUNTER — Ambulatory Visit (INDEPENDENT_AMBULATORY_CARE_PROVIDER_SITE_OTHER): Payer: Medicare Other | Admitting: Family Medicine

## 2013-03-16 VITALS — BP 127/73 | HR 64 | Temp 97.9°F | Ht 63.0 in | Wt 143.4 lb

## 2013-03-16 DIAGNOSIS — I10 Essential (primary) hypertension: Secondary | ICD-10-CM | POA: Diagnosis not present

## 2013-03-16 DIAGNOSIS — E785 Hyperlipidemia, unspecified: Secondary | ICD-10-CM | POA: Diagnosis not present

## 2013-03-16 DIAGNOSIS — E119 Type 2 diabetes mellitus without complications: Secondary | ICD-10-CM | POA: Diagnosis not present

## 2013-03-16 LAB — COMPLETE METABOLIC PANEL WITH GFR
ALT: 10 U/L (ref 0–35)
AST: 14 U/L (ref 0–37)
Albumin: 4.5 g/dL (ref 3.5–5.2)
Alkaline Phosphatase: 58 U/L (ref 39–117)
BUN: 17 mg/dL (ref 6–23)
CO2: 24 mEq/L (ref 19–32)
Calcium: 9.8 mg/dL (ref 8.4–10.5)
Chloride: 104 mEq/L (ref 96–112)
Creat: 0.79 mg/dL (ref 0.50–1.10)
GFR, Est African American: 87 mL/min
GFR, Est Non African American: 76 mL/min
Glucose, Bld: 78 mg/dL (ref 70–99)
Potassium: 3.9 mEq/L (ref 3.5–5.3)
Sodium: 138 mEq/L (ref 135–145)
Total Bilirubin: 0.3 mg/dL (ref 0.3–1.2)
Total Protein: 6.9 g/dL (ref 6.0–8.3)

## 2013-03-16 LAB — POCT GLYCOSYLATED HEMOGLOBIN (HGB A1C): Hemoglobin A1C: 5.8

## 2013-03-16 MED ORDER — CARVEDILOL 3.125 MG PO TABS: 3.1250 mg | ORAL_TABLET | Freq: Two times a day (BID) | ORAL | Status: DC

## 2013-03-16 MED ORDER — ZOLPIDEM TARTRATE 10 MG PO TABS: 10.0000 mg | ORAL_TABLET | Freq: Every evening | ORAL | Status: DC | PRN

## 2013-03-16 MED ORDER — EZETIMIBE-SIMVASTATIN 10-40 MG PO TABS: 1.0000 | ORAL_TABLET | Freq: Every day | ORAL | Status: DC

## 2013-03-16 MED ORDER — AMLODIPINE BESYLATE 10 MG PO TABS: 10.0000 mg | ORAL_TABLET | Freq: Every day | ORAL | Status: DC

## 2013-03-16 MED ORDER — CLOPIDOGREL BISULFATE 75 MG PO TABS: 75.0000 mg | ORAL_TABLET | Freq: Every day | ORAL | Status: DC

## 2013-03-16 MED ORDER — METFORMIN HCL 500 MG PO TABS: 500.0000 mg | ORAL_TABLET | Freq: Two times a day (BID) | ORAL | Status: DC

## 2013-03-17 LAB — NMR LIPOPROFILE WITH LIPIDS
Cholesterol, Total: 133 mg/dL (ref <200)
HDL Particle Number: 29.4 umol/L — ABNORMAL LOW (ref >=30.5)
HDL Size: 9.6 nm (ref >=9.2)
HDL-C: 41 mg/dL (ref >=40)
LDL (calc): 71 mg/dL (ref <100)
LDL Particle Number: 1262 nmol/L — ABNORMAL HIGH (ref <1000)
LDL Size: 19.9 nm — ABNORMAL LOW (ref >20.5)
LP-IR Score: 36 (ref <=45)
Large HDL-P: 8 umol/L (ref >=4.8)
Large VLDL-P: 1.6 nmol/L (ref <=2.7)
Small LDL Particle Number: 891 nmol/L — ABNORMAL HIGH (ref <=527)
Triglycerides: 106 mg/dL (ref <150)
VLDL Size: 48.4 nm — ABNORMAL HIGH (ref <=46.6)

## 2013-03-17 NOTE — Progress Notes (Signed)
Quick Note:  Lab result at goal. No change in Medications for now. No Change in plans and follow up. Await the urine test. ______

## 2013-03-17 NOTE — Progress Notes (Signed)
Patient ID: Diana Garcia, female   DOB: [birth year], 72 y.o.   MRN: [id number] SUBJECTIVE: HPI: Patient known to me from [facility] and [facility]. Came for continued Primary care. Patient is here for follow up of Diabetes Mellitus, Hypertension/ Hyperlipidemia..Symptoms of DM:has had no Nocturia ,deniesUrinary Frequency ,denies Blurred vision ,deniesDizziness,denies.Dysuria,deniesparesthesias, deniesextremity pain or ulcers .denieschest pain. .has had an annual eye exam. do check the feet. doescheck CBGs. Average CBG:________.Marland Kitchen deniesto episodes of hypoglycemia. doeshave an emergency hypoglycemic plan. admits toCompliance with medications. deniesProblems with medications.   PMH/PSH: reviewed/updated in Epic  SH/FH: reviewed/updated in Epic  Allergies: reviewed/updated in Epic  Medications: reviewed/updated in Epic  Immunizations: reviewed/updated in Epic  ROS: As above in the HPI. All other systems are stable or negative.  OBJECTIVE: APPEARANCE:  Patient in no acute distress.The patient appeared well nourished and normally developed. Acyanotic. Waist: VITAL SIGNS:  SKIN: warm and  Dry without overt rashes, tattoos and scars  HEAD and Neck: without JVD, Head and scalp: normal Eyes:No scleral icterus. Fundi normal, eye movements normal. Ears: Auricle normal, canal normal, Tympanic membranes normal, insufflation normal. Nose: normal Throat: normal Neck & thyroid: normal  CHEST & LUNGS: Chest wall: normal Lungs: Clear  CVS: Reveals the PMI to be normally located. Regular rhythm, First and Second Heart sounds are normal,  absence of murmurs, rubs or gallops. Peripheral vasculature: Radial pulses: normal Dorsal pedis pulses: normal Posterior pulses: normal  ABDOMEN:  Appearance: normal Benign,, no organomegaly, no masses, no Abdominal Aortic enlargement. No Guarding , no rebound. No Bruits. Bowel sounds: normal  RECTAL: N/A GU:  N/A  EXTREMETIES: nonedematous. Both Femoral and Pedal pulses are normal.  MUSCULOSKELETAL:  Spine: normal Joints: intact  NEUROLOGIC: oriented to time,place and person; nonfocal. Strength is normal Sensory is normal Reflexes are normal Cranial Nerves are normal.  ASSESSMENT: HTN (hypertension) - Plan: COMPLETE METABOLIC PANEL WITH GFR  HLD (hyperlipidemia) - Plan: COMPLETE METABOLIC PANEL WITH GFR, NMR Lipoprofile with Lipids  Type II or unspecified type diabetes mellitus without mention of complication, not stated as uncontrolled - Plan: POCT glycosylated hemoglobin (Hb A1C), POCT UA - Microalbumin  Insomnia, secondary to the death of a woman who was like a daughter to her. PLAN:  Orders Placed This Encounter  Procedures  . COMPLETE METABOLIC PANEL WITH GFR  . NMR Lipoprofile with Lipids  . POCT glycosylated hemoglobin (Hb A1C)  . POCT UA - Microalbumin   Results for orders placed in visit on 03/16/13 (from the past 24 hour(s))  POCT GLYCOSYLATED HEMOGLOBIN (HGB A1C)     Status: None   Collection Time    03/16/13  5:06 PM      Result Value Range   Hemoglobin A1C 5.8     Meds ordered this encounter  Medications  . DISCONTD: metFORMIN (GLUCOPHAGE) 500 MG tablet    Sig: Take 500 mg by mouth 2 (two) times daily with a meal.   . metFORMIN (GLUCOPHAGE) 500 MG tablet    Sig: Take 1 tablet (500 mg total) by mouth 2 (two) times daily with a meal.    Dispense:  180 tablet    Refill:  3  . ezetimibe-simvastatin (VYTORIN) 10-40 MG per tablet    Sig: Take 1 tablet by mouth at bedtime.    Dispense:  90 tablet    Refill:  3  . clopidogrel (PLAVIX) 75 MG tablet    Sig: Take 1 tablet (75 mg total) by mouth daily.    Dispense:  90 tablet  Refill:  3  . carvedilol (COREG) 3.125 MG tablet    Sig: Take 1 tablet (3.125 mg total) by mouth 2 (two) times daily with a meal.    Dispense:  90 tablet    Refill:  3  . amLODipine (NORVASC) 10 MG tablet    Sig: Take 1 tablet (10 mg  total) by mouth daily.    Dispense:  90 tablet    Refill:  3  . zolpidem (AMBIEN) 10 MG tablet    Sig: Take 1 tablet (10 mg total) by mouth at bedtime as needed for sleep.    Dispense:  30 tablet    Refill:  0  reviewed medications. Rx short term Remus Loffler to help with sleep. Grief counselling as appropriate . Diet and exercise reviiewed. RTc in 3 months.  Janson Lamar P. [place] Charon, M.D.

## 2013-04-25 ENCOUNTER — Telehealth: Payer: Self-pay | Admitting: Family Medicine

## 2013-04-25 NOTE — Telephone Encounter (Signed)
Please advise 

## 2013-04-25 NOTE — Telephone Encounter (Signed)
Patients pharmacy calling to verify an RX it is being taken care of in another encounter.

## 2013-04-26 ENCOUNTER — Other Ambulatory Visit: Payer: Self-pay | Admitting: Family Medicine

## 2013-04-26 DIAGNOSIS — E785 Hyperlipidemia, unspecified: Secondary | ICD-10-CM

## 2013-04-26 NOTE — Telephone Encounter (Signed)
Discussed with patient. She was on the vytorin 10-20 once daily only. Data entry error. Corrected the information in EPIC and called [facility] and spoke with a Pharmacist and corrected.

## 2013-04-26 NOTE — Telephone Encounter (Signed)
Received a call from [facility], states they got a rx for vytorin 10/40, and pt is on amolodipine 10mg . If you want the pt to continue this dose, Almira Coaster H needs to call [phone] and use ref#[id number]. Chart is sent back

## 2013-06-07 ENCOUNTER — Encounter: Payer: Self-pay | Admitting: Obstetrics & Gynecology

## 2013-06-07 ENCOUNTER — Ambulatory Visit (INDEPENDENT_AMBULATORY_CARE_PROVIDER_SITE_OTHER): Payer: Medicare Other | Admitting: Obstetrics & Gynecology

## 2013-06-07 DIAGNOSIS — Z124 Encounter for screening for malignant neoplasm of cervix: Secondary | ICD-10-CM | POA: Diagnosis not present

## 2013-06-07 NOTE — Addendum Note (Signed)
Addended by: George Hugh on: 06/07/2013 05:25 PM   Modules accepted: Orders

## 2013-06-07 NOTE — Progress Notes (Signed)
.   Subjective:     Diana Garcia is a 72 y.o. female here for a routine exam.  No current complaints.  Personal health questionnaire reviewed: yes.   Gynecologic History No LMP recorded. Patient is postmenopausal. Contraception: none Last Pap: 2013. Results were: normal Last mammogram: 2014. Results were: normal  Obstetric History OB History   Grav Para Term Preterm Abortions TAB SAB Ect Mult Living                   The following portions of the patient's history were reviewed and updated as appropriate: allergies, current medications, past family history, past medical history, past social history, past surgical history and problem list.  Review of Systems Pertinent items are noted in HPI.    Objective:    General appearance: alert and no distress Breasts: normal appearance, no masses or tenderness Abdomen: normal findings: soft, non-tender Pelvic: cervix normal in appearance, external genitalia normal, no adnexal masses or tenderness, no cervical motion tenderness, uterus normal size, shape, and consistency and vagina normal without discharge    Assessment:    Healthy female exam.    Plan:    Education reviewed: calcium supplements and self breast exams.    F/U 2 years.

## 2013-06-08 DIAGNOSIS — Z124 Encounter for screening for malignant neoplasm of cervix: Secondary | ICD-10-CM | POA: Diagnosis not present

## 2013-06-08 DIAGNOSIS — E119 Type 2 diabetes mellitus without complications: Secondary | ICD-10-CM | POA: Diagnosis not present

## 2013-06-08 DIAGNOSIS — I10 Essential (primary) hypertension: Secondary | ICD-10-CM | POA: Diagnosis not present

## 2013-06-08 DIAGNOSIS — E7800 Pure hypercholesterolemia, unspecified: Secondary | ICD-10-CM | POA: Diagnosis not present

## 2013-06-08 DIAGNOSIS — I70219 Atherosclerosis of native arteries of extremities with intermittent claudication, unspecified extremity: Secondary | ICD-10-CM | POA: Diagnosis not present

## 2013-06-09 LAB — PAP IG W/ RFLX HPV ASCU

## 2013-06-12 DIAGNOSIS — R0989 Other specified symptoms and signs involving the circulatory and respiratory systems: Secondary | ICD-10-CM | POA: Diagnosis not present

## 2013-06-15 ENCOUNTER — Ambulatory Visit (INDEPENDENT_AMBULATORY_CARE_PROVIDER_SITE_OTHER): Payer: Medicare Other | Admitting: Family Medicine

## 2013-06-15 ENCOUNTER — Encounter: Payer: Self-pay | Admitting: Family Medicine

## 2013-06-15 VITALS — BP 129/78 | HR 63 | Temp 99.2°F | Ht 61.75 in | Wt 140.4 lb

## 2013-06-15 DIAGNOSIS — E785 Hyperlipidemia, unspecified: Secondary | ICD-10-CM | POA: Diagnosis not present

## 2013-06-15 DIAGNOSIS — E119 Type 2 diabetes mellitus without complications: Secondary | ICD-10-CM | POA: Diagnosis not present

## 2013-06-15 DIAGNOSIS — I739 Peripheral vascular disease, unspecified: Secondary | ICD-10-CM | POA: Diagnosis not present

## 2013-06-15 DIAGNOSIS — I10 Essential (primary) hypertension: Secondary | ICD-10-CM

## 2013-06-15 LAB — COMPLETE METABOLIC PANEL WITH GFR
ALT: 10 U/L (ref 0–35)
AST: 14 U/L (ref 0–37)
Albumin: 4.6 g/dL (ref 3.5–5.2)
Alkaline Phosphatase: 61 U/L (ref 39–117)
BUN: 12 mg/dL (ref 6–23)
CO2: 28 mEq/L (ref 19–32)
Calcium: 9.9 mg/dL (ref 8.4–10.5)
Chloride: 108 mEq/L (ref 96–112)
Creat: 0.65 mg/dL (ref 0.50–1.10)
GFR, Est African American: >89 mL/min
GFR, Est Non African American: >89 mL/min
Glucose, Bld: 88 mg/dL (ref 70–99)
Potassium: 4.5 mEq/L (ref 3.5–5.3)
Sodium: 143 mEq/L (ref 135–145)
Total Bilirubin: 0.3 mg/dL (ref 0.3–1.2)
Total Protein: 7 g/dL (ref 6.0–8.3)

## 2013-06-15 LAB — POCT GLYCOSYLATED HEMOGLOBIN (HGB A1C): Hemoglobin A1C: 5.6

## 2013-06-15 NOTE — Patient Instructions (Addendum)
      Dr Mayford Alberg's Recommendations  Diet and Exercise discussed with patient.  For nutrition information, I recommend books:  1).Eat to Live by Dr Joel Fuhrman. 2).Prevent and Reverse Heart Disease by Dr Caldwell Esselstyn. 3) Dr Neal Barnard's Book:  Program to Reverse Diabetes  Exercise recommendations are:  If unable to walk, then the patient can exercise in a chair 3 times a day. By flapping arms like a bird gently and raising legs outwards to the front.  If ambulatory, the patient can go for walks for 30 minutes 3 times a week. Then increase the intensity and duration as tolerated.  Goal is to try to attain exercise frequency to 5 times a week.  If applicable: Best to perform resistance exercises (machines or weights) 2 days a week and cardio type exercises 3 days per week.  

## 2013-06-15 NOTE — Progress Notes (Signed)
Patient ID: Diana Garcia, female   DOB: 1941/03/26, 72 y.o.   MRN: [id number] SUBJECTIVE: CC: Chief Complaint  Patient presents with  . Follow-up    3 month saw dr Anselm Jungling and repeated the ultrasound and doppler will see him tomorrow   . Medication Refill    refill ambien    HPI:  1)Has PVD and  Saw Dr Jacinto Halim last Monday and he heard a bruit and he ordered a doppler of the Carotids. Awaiting that , the appointment is tomorrow.  2)Patient is here for follow up of Diabetes Mellitus/htn/hld: Symptoms of DM: Denies Nocturia ,Denies Urinary Frequency , denies Blurred vision ,deniesDizziness,denies.Dysuria,denies paresthesias, denies extremity pain or ulcers.Marland Kitchendenies chest pain. has had an annual eye exam. do check the feet. Does check CBGs. Average CBG:83 at times. Denies episodes of hypoglycemia. Does have an emergency hypoglycemic plan. admits toCompliance with medications. Denies Problems with medications. Exercise: some  Breakfast: 2 oz OJ, 1/2 slice of yam, tomatoes and onions. Lunch: 4 sips of Merlot, 1/2 of a burger, zuccini, and  Spinach Supper: none last night  3)htn: Patient is here for follow up of hypertension: denies Headache;deniesChest Pain;denies weakness;denies Shortness of Breath or Orthopnea;denies Visual changes;denies palpitations;denies cough;denies pedal edema;denies symptoms of TIA or stroke; admits to Compliance with medications. denies Problems with medications.  Past Medical History  Diagnosis Date  . Closed fracture of fifth metatarsal bone   . Hyperlipidemia   . Hypertension   . Insomnia   . Diabetes mellitus without complication   . Insomnia    No past surgical history on file. History   Social History  . Marital Status: Married    Spouse Name: N/A    Number of Children: N/A  . Years of Education: N/A   Occupational History  . Not on file.   Social History Main Topics  . Smoking status: Never Smoker   . Smokeless tobacco: Not on file   . Alcohol Use: No  . Drug Use: No  . Sexually Active: Not on file   Other Topics Concern  . Not on file   Social History Narrative  . No narrative on file   Family History  Problem Relation Age of Onset  . Hyperlipidemia Mother   . Hypertension Mother   . Diabetes Father   . Heart attack Father   . Diabetes Sister   . Hypertension Sister   . Diabetes Brother   . Sudden death Neg Hx    Current Outpatient Prescriptions on File Prior to Visit  Medication Sig Dispense Refill  . amLODipine (NORVASC) 10 MG tablet Take 1 tablet (10 mg total) by mouth daily.  90 tablet  3  . aspirin EC 81 MG tablet Take 81 mg by mouth daily.      . Calcium Carbonate-Vitamin D (CALCIUM 600 + D PO) Take 1 tablet by mouth 3 (three) times a week. No specific days      . carvedilol (COREG) 3.125 MG tablet Take 1 tablet (3.125 mg total) by mouth 2 (two) times daily with a meal.  90 tablet  3  . Cholecalciferol (VITAMIN D PO) Take 4,000 Units by mouth 3 (three) times a week. No specific days      . clopidogrel (PLAVIX) 75 MG tablet Take 1 tablet (75 mg total) by mouth daily.  90 tablet  3  . ezetimibe-simvastatin (VYTORIN) 10-20 MG per tablet Take 1 tablet by mouth at bedtime.  90 tablet  3  . metFORMIN (GLUCOPHAGE) 500 MG tablet Take  1 tablet (500 mg total) by mouth 2 (two) times daily with a meal.  180 tablet  3   No current facility-administered medications on file prior to visit.   Allergies  Allergen Reactions  . Lisinopril     fainting  . Penicillins Hives    There is no immunization history on file for this patient. Prior to Admission medications   Medication Sig Start Date End Date Taking? Authorizing Provider  amLODipine (NORVASC) 10 MG tablet Take 1 tablet (10 mg total) by mouth daily. 03/16/13  Yes Ileana Ladd, MD  aspirin EC 81 MG tablet Take 81 mg by mouth daily.   Yes Historical Provider, MD  Calcium Carbonate-Vitamin D (CALCIUM 600 + D PO) Take 1 tablet by mouth 3 (three) times a  week. No specific days   Yes Historical Provider, MD  carvedilol (COREG) 3.125 MG tablet Take 1 tablet (3.125 mg total) by mouth 2 (two) times daily with a meal. 03/16/13  Yes Ileana Ladd, MD  Cholecalciferol (VITAMIN D PO) Take 4,000 Units by mouth 3 (three) times a week. No specific days   Yes Historical Provider, MD  clopidogrel (PLAVIX) 75 MG tablet Take 1 tablet (75 mg total) by mouth daily. 03/16/13  Yes Ileana Ladd, MD  ezetimibe-simvastatin (VYTORIN) 10-20 MG per tablet Take 1 tablet by mouth at bedtime. 04/26/13  Yes Ileana Ladd, MD  losartan (COZAAR) 25 MG tablet  06/08/13  Yes Historical Provider, MD  metFORMIN (GLUCOPHAGE) 500 MG tablet Take 1 tablet (500 mg total) by mouth 2 (two) times daily with a meal. 03/16/13  Yes Ileana Ladd, MD  zolpidem (AMBIEN) 10 MG tablet Take 10 mg by mouth at bedtime as needed.  03/17/13  Yes Historical Provider, MD    ROS: As above in the HPI. All other systems are stable or negative.  OBJECTIVE: APPEARANCE:  Patient in no acute distress.The patient appeared well nourished and normally developed. Acyanotic. Waist: VITAL SIGNS:BP 129/78  Pulse 63  Temp(Src) 99.2 F (37.3 C) (Oral)  Ht 5' 1.75" (1.568 m)  Wt 140 lb 6.4 oz (63.685 kg)  BMI 25.9 kg/m2 AAF  SKIN: warm and  Dry without overt rashes, tattoos and scars  HEAD and Neck: without JVD, Head and scalp: normal Eyes:No scleral icterus. Fundi normal, eye movements normal. Ears: Auricle normal, canal normal, Tympanic membranes normal, insufflation normal. Nose: normal Throat: normal Neck & thyroid: normal. Right bruit faintly audible but present.  CHEST & LUNGS: Chest wall: normal Lungs: Clear  CVS: Reveals the PMI to be normally located. Regular rhythm, First and Second Heart sounds are normal,  absence of murmurs, rubs or gallops.  Peripheral vasculature: Radial pulses: normal  ABDOMEN:  Appearance: normal Benign, no organomegaly, no masses, no Abdominal Aortic  enlargement. No Guarding , no rebound. No Bruits. Bowel sounds: normal  RECTAL: N/A GU: N/A  EXTREMETIES: nonedematous.  MUSCULOSKELETAL:  Spine: normal Joints: intact  NEUROLOGIC: oriented to time,place and person; nonfocal. Strength is normal Sensory is normal Reflexes are normal Cranial Nerves are normal.    ASSESSMENT: Claudication in peripheral vascular disease  Type II or unspecified type diabetes mellitus without mention of complication, not stated as uncontrolled - Plan: POCT glycosylated hemoglobin (Hb A1C), COMPLETE METABOLIC PANEL WITH GFR  Hyperlipidemia - Plan: NMR Lipoprofile with Lipids  HTN (hypertension) - Plan: COMPLETE METABOLIC PANEL WITH GFR  HLD (hyperlipidemia) - Plan: COMPLETE METABOLIC PANEL WITH GFR right carotid Bruit  Reviewed with patient Dr Verl Dicker scanned records  from last week.  PLAN: Orders Placed This Encounter  Procedures  . COMPLETE METABOLIC PANEL WITH GFR  . NMR Lipoprofile with Lipids  . POCT glycosylated hemoglobin (Hb A1C)   Meds ordered this encounter  Medications  . losartan (COZAAR) 25 MG tablet    Sig:   . zolpidem (AMBIEN) 10 MG tablet    Sig: Take 10 mg by mouth at bedtime as needed.         Dr Woodroe Mode Recommendations  Diet and Exercise discussed with patient.  For nutrition information, I recommend books:  1).Eat to Live by Dr Monico Hoar. 2).Prevent and Reverse Heart Disease by Dr Suzzette Righter. 3) Dr Katherina Right Book:  Program to Reverse Diabetes  Exercise recommendations are:  If unable to walk, then the patient can exercise in a chair 3 times a day. By flapping arms like a bird gently and raising legs outwards to the front.  If ambulatory, the patient can go for walks for 30 minutes 3 times a week. Then increase the intensity and duration as tolerated.  Goal is to try to attain exercise frequency to 5 times a week.  If applicable: Best to perform resistance exercises (machines or  weights) 2 days a week and cardio type exercises 3 days per week.  Return in about 4 months (around 10/16/2013) for Recheck medical problems. Discussed with patient a plant based diet and the benefits on cardiovascular disease. Follow up with Dr Jacinto Halim as planned.  Griffyn Kucinski P. [place] Charon, M.D.

## 2013-06-16 DIAGNOSIS — I658 Occlusion and stenosis of other precerebral arteries: Secondary | ICD-10-CM | POA: Diagnosis not present

## 2013-06-16 DIAGNOSIS — I6529 Occlusion and stenosis of unspecified carotid artery: Secondary | ICD-10-CM | POA: Diagnosis not present

## 2013-06-16 DIAGNOSIS — E7800 Pure hypercholesterolemia, unspecified: Secondary | ICD-10-CM | POA: Diagnosis not present

## 2013-06-16 DIAGNOSIS — I10 Essential (primary) hypertension: Secondary | ICD-10-CM | POA: Diagnosis not present

## 2013-06-16 LAB — NMR LIPOPROFILE WITH LIPIDS
Cholesterol, Total: 134 mg/dL (ref <200)
HDL Particle Number: 32.2 umol/L (ref >=30.5)
HDL Size: 9.6 nm (ref >=9.2)
HDL-C: 43 mg/dL (ref >=40)
LDL (calc): 76 mg/dL (ref <100)
LDL Particle Number: 965 nmol/L (ref <1000)
LDL Size: 19.9 nm — ABNORMAL LOW (ref >20.5)
LP-IR Score: 50 — ABNORMAL HIGH (ref <=45)
Large HDL-P: 6.3 umol/L (ref >=4.8)
Large VLDL-P: 1.6 nmol/L (ref <=2.7)
Small LDL Particle Number: 651 nmol/L — ABNORMAL HIGH (ref <=527)
Triglycerides: 74 mg/dL (ref <150)
VLDL Size: 56.7 nm — ABNORMAL HIGH (ref <=46.6)

## 2013-06-17 NOTE — Progress Notes (Signed)
Quick Note:  Lab result at goal.The DM is too well controlled Can reduce the metformin to one daily. The rest of the labs are excellent. Otherwise no Change in plans and follow up. ______

## 2013-06-26 DIAGNOSIS — I70219 Atherosclerosis of native arteries of extremities with intermittent claudication, unspecified extremity: Secondary | ICD-10-CM | POA: Diagnosis not present

## 2013-07-07 DIAGNOSIS — I10 Essential (primary) hypertension: Secondary | ICD-10-CM | POA: Diagnosis not present

## 2013-08-15 ENCOUNTER — Encounter: Payer: Self-pay | Admitting: *Deleted

## 2013-10-17 ENCOUNTER — Ambulatory Visit: Payer: Medicare Other | Admitting: Family Medicine

## 2013-10-23 ENCOUNTER — Ambulatory Visit (INDEPENDENT_AMBULATORY_CARE_PROVIDER_SITE_OTHER): Payer: Medicare Other | Admitting: Family Medicine

## 2013-10-23 ENCOUNTER — Encounter: Payer: Self-pay | Admitting: Family Medicine

## 2013-10-23 VITALS — BP 127/73 | HR 58 | Temp 97.3°F | Ht 63.0 in | Wt 140.6 lb

## 2013-10-23 DIAGNOSIS — I10 Essential (primary) hypertension: Secondary | ICD-10-CM | POA: Diagnosis not present

## 2013-10-23 DIAGNOSIS — E119 Type 2 diabetes mellitus without complications: Secondary | ICD-10-CM

## 2013-10-23 DIAGNOSIS — Z8639 Personal history of other endocrine, nutritional and metabolic disease: Secondary | ICD-10-CM

## 2013-10-23 DIAGNOSIS — E785 Hyperlipidemia, unspecified: Secondary | ICD-10-CM | POA: Diagnosis not present

## 2013-10-23 DIAGNOSIS — I739 Peripheral vascular disease, unspecified: Secondary | ICD-10-CM

## 2013-10-23 DIAGNOSIS — Z862 Personal history of diseases of the blood and blood-forming organs and certain disorders involving the immune mechanism: Secondary | ICD-10-CM

## 2013-10-23 LAB — POCT GLYCOSYLATED HEMOGLOBIN (HGB A1C): Hemoglobin A1C: 6

## 2013-10-23 LAB — POCT UA - MICROALBUMIN: Microalbumin Ur, POC: NEGATIVE mg/L

## 2013-10-23 MED ORDER — CARVEDILOL 3.125 MG PO TABS: 3.1250 mg | ORAL_TABLET | Freq: Two times a day (BID) | ORAL | Status: DC

## 2013-10-23 NOTE — Progress Notes (Signed)
Patient ID: Diana Garcia, female   DOB: January 01, 1941, 72 y.o.   MRN: [id number] SUBJECTIVE: CC: Chief Complaint  Patient presents with  . Follow-up    4 month   . Medication Refill    refill coreg     HPI:  Patient is here for follow up of Diabetes Mellitus/HLD/HTN: Symptoms evaluated: Denies Nocturia ,Denies Urinary Frequency , denies Blurred vision ,deniesDizziness,denies.Dysuria,denies paresthesias, denies extremity pain or ulcers.Marland Kitchendenies chest pain. has had an annual eye exam. do check the feet. Does check CBGs. Average CBG:low 100s Denies episodes of hypoglycemia. Does have an emergency hypoglycemic plan. admits toCompliance with medications. Denies Problems with medications.  Past Medical History  Diagnosis Date  . Closed fracture of fifth metatarsal bone   . Hyperlipidemia   . Hypertension   . Insomnia   . Diabetes mellitus without complication   . Insomnia    No past surgical history on file. History   Social History  . Marital Status: Married    Spouse Name: N/A    Number of Children: N/A  . Years of Education: N/A   Occupational History  . Not on file.   Social History Main Topics  . Smoking status: Never Smoker   . Smokeless tobacco: Not on file  . Alcohol Use: No  . Drug Use: No  . Sexual Activity: Not on file   Other Topics Concern  . Not on file   Social History Narrative  . No narrative on file   Family History  Problem Relation Age of Onset  . Hyperlipidemia Mother   . Hypertension Mother   . Diabetes Father   . Heart attack Father   . Diabetes Sister   . Hypertension Sister   . Diabetes Brother   . Sudden death Neg Hx    Current Outpatient Prescriptions on File Prior to Visit  Medication Sig Dispense Refill  . amLODipine (NORVASC) 10 MG tablet Take 1 tablet (10 mg total) by mouth daily.  90 tablet  3  . aspirin EC 81 MG tablet Take 81 mg by mouth daily.      . Calcium Carbonate-Vitamin D (CALCIUM 600 + D PO) Take 1 tablet by  mouth 3 (three) times a week. No specific days      . Cholecalciferol (VITAMIN D PO) Take 4,000 Units by mouth 3 (three) times a week. No specific days      . clopidogrel (PLAVIX) 75 MG tablet Take 1 tablet (75 mg total) by mouth daily.  90 tablet  3  . ezetimibe-simvastatin (VYTORIN) 10-20 MG per tablet Take 1 tablet by mouth at bedtime.  90 tablet  3  . losartan (COZAAR) 25 MG tablet       . zolpidem (AMBIEN) 10 MG tablet Take 10 mg by mouth at bedtime as needed.        No current facility-administered medications on file prior to visit.   Allergies  Allergen Reactions  . Lisinopril     fainting  . Penicillins Hives   Immunization History  Administered Date(s) Administered  . Pneumococcal-Unspecified 11/30/2006  . Tdap 11/30/2004   Prior to Admission medications   Medication Sig Start Date End Date Taking? Authorizing Provider  amLODipine (NORVASC) 10 MG tablet Take 1 tablet (10 mg total) by mouth daily. 03/16/13  Yes Ileana Ladd, MD  aspirin EC 81 MG tablet Take 81 mg by mouth daily.   Yes Historical Provider, MD  Calcium Carbonate-Vitamin D (CALCIUM 600 + D PO) Take 1 tablet by mouth  3 (three) times a week. No specific days   Yes Historical Provider, MD  carvedilol (COREG) 3.125 MG tablet Take 1 tablet (3.125 mg total) by mouth 2 (two) times daily with a meal. 03/16/13  Yes Ileana Ladd, MD  Cholecalciferol (VITAMIN D PO) Take 4,000 Units by mouth 3 (three) times a week. No specific days   Yes Historical Provider, MD  clopidogrel (PLAVIX) 75 MG tablet Take 1 tablet (75 mg total) by mouth daily. 03/16/13  Yes Ileana Ladd, MD  ezetimibe-simvastatin (VYTORIN) 10-20 MG per tablet Take 1 tablet by mouth at bedtime. 04/26/13  Yes Ileana Ladd, MD  losartan (COZAAR) 25 MG tablet  06/08/13  Yes Historical Provider, MD  metFORMIN (GLUCOPHAGE) 500 MG tablet Take 1 tablet (500 mg total) by mouth 2 (two) times daily with a meal. 03/16/13  Yes Ileana Ladd, MD  niacin 500 MG tablet Take  500 mg by mouth at bedtime.   Yes Historical Provider, MD  zolpidem (AMBIEN) 10 MG tablet Take 10 mg by mouth at bedtime as needed.  03/17/13   Historical Provider, MD     ROS: As above in the HPI. All other systems are stable or negative.  OBJECTIVE: APPEARANCE:  Patient in no acute distress.The patient appeared well nourished and normally developed. Acyanotic. Waist: VITAL SIGNS:BP 127/73  Pulse 58  Temp(Src) 97.3 F (36.3 C) (Oral)  Ht 5\' 3"  (1.6 m)  Wt 140 lb 9.6 oz (63.776 kg)  BMI 24.91 kg/m2 afro-caribbean Lady  SKIN: warm and  Dry without overt rashes, tattoos and scars  HEAD and Neck: without JVD, Head and scalp: normal Eyes:No scleral icterus. Fundi normal, eye movements normal. Ears: Auricle normal, canal normal, Tympanic membranes normal, insufflation normal. Nose: normal Throat: normal Neck & thyroid: normal  CHEST & LUNGS: Chest wall: normal Lungs: Clear  CVS: Reveals the PMI to be normally located. Regular rhythm, First and Second Heart sounds are normal,  absence of murmurs, rubs or gallops. Peripheral vasculature: Radial pulses: normal Dorsal pedis pulses: normal Posterior pulses: normal  ABDOMEN:  Appearance: normal Benign, no organomegaly, no masses, no Abdominal Aortic enlargement. No Guarding , no rebound. No Bruits. Bowel sounds: normal  RECTAL: N/A GU: N/A  EXTREMETIES: nonedematous.  MUSCULOSKELETAL:  Spine: normal Joints: intact  NEUROLOGIC: oriented to time,place and person; nonfocal. Strength is normal Sensory is normal Reflexes are normal Cranial Nerves are normal.  ASSESSMENT: Claudication in peripheral vascular disease  History of hyperglycemia  HLD (hyperlipidemia) - Plan: NMR, lipoprofile  HTN (hypertension) - Plan: CMP14+EGFR  Type II or unspecified type diabetes mellitus without mention of complication, not stated as uncontrolled - Plan: POCT glycosylated hemoglobin (Hb A1C), POCT UA - Microalbumin,  CMP14+EGFR  PLAN:  Orders Placed This Encounter  Procedures  . CMP14+EGFR  . NMR, lipoprofile  . POCT glycosylated hemoglobin (Hb A1C)  . POCT UA - Microalbumin   Meds ordered this encounter  Medications  . niacin 500 MG tablet    Sig: Take 500 mg by mouth at bedtime.  . carvedilol (COREG) 3.125 MG tablet    Sig: Take 1 tablet (3.125 mg total) by mouth 2 (two) times daily with a meal.    Dispense:  90 tablet    Refill:  3  . metFORMIN (GLUCOPHAGE) 500 MG tablet    Sig: Take 500 mg by mouth daily with breakfast.   Medications Discontinued During This Encounter  Medication Reason  . carvedilol (COREG) 3.125 MG tablet Reorder  . metFORMIN (GLUCOPHAGE)  500 MG tablet   DM footcare emphasized Continue healthy lifestyle.  Return in about 4 months (around 02/20/2014) for Recheck medical problems.  Rolin Schult P. [place] Charon, M.D.

## 2013-10-23 NOTE — Patient Instructions (Signed)
Diabetes and Foot Care Diabetes may cause you to have problems because of poor blood supply (circulation) to your feet and legs. This may cause the skin on your feet to become thinner, break easier, and heal more slowly. Your skin may become dry, and the skin may peel and crack. You may also have nerve damage in your legs and feet causing decreased feeling in them. You may not notice minor injuries to your feet that could lead to infections or more serious problems. Taking care of your feet is one of the most important things you can do for yourself.  HOME CARE INSTRUCTIONS  Wear shoes at all times, even in the house. Do not go barefoot. Bare feet are easily injured.  Check your feet daily for blisters, cuts, and redness. If you cannot see the bottom of your feet, use a mirror or ask someone for help.  Wash your feet with warm water (do not use hot water) and mild soap. Then pat your feet and the areas between your toes until they are completely dry. Do not soak your feet as this can dry your skin.  Apply a moisturizing lotion or petroleum jelly (that does not contain alcohol and is unscented) to the skin on your feet and to dry, brittle toenails. Do not apply lotion between your toes.  Trim your toenails straight across. Do not dig under them or around the cuticle. File the edges of your nails with an emery board or nail file.  Do not cut corns or calluses or try to remove them with medicine.  Wear clean socks or stockings every day. Make sure they are not too tight. Do not wear knee-high stockings since they may decrease blood flow to your legs.  Wear shoes that fit properly and have enough cushioning. To break in new shoes, wear them for just a few hours a day. This prevents you from injuring your feet. Always look in your shoes before you put them on to be sure there are no objects inside.  Do not cross your legs. This may decrease the blood flow to your feet.  If you find a minor scrape,  cut, or break in the skin on your feet, keep it and the skin around it clean and dry. These areas may be cleansed with mild soap and water. Do not cleanse the area with peroxide, alcohol, or iodine.  When you remove an adhesive bandage, be sure not to damage the skin around it.  If you have a wound, look at it several times a day to make sure it is healing.  Do not use heating pads or hot water bottles. They may burn your skin. If you have lost feeling in your feet or legs, you may not know it is happening until it is too late.  Make sure your health care provider performs a complete foot exam at least annually or more often if you have foot problems. Report any cuts, sores, or bruises to your health care provider immediately. SEEK MEDICAL CARE IF:   You have an injury that is not healing.  You have cuts or breaks in the skin.  You have an ingrown nail.  You notice redness on your legs or feet.  You feel burning or tingling in your legs or feet.  You have pain or cramps in your legs and feet.  Your legs or feet are numb.  Your feet always feel cold. SEEK IMMEDIATE MEDICAL CARE IF:   There is increasing redness,   swelling, or pain in or around a wound.  There is a red line that goes up your leg.  Pus is coming from a wound.  You develop a fever or as directed by your health care provider.  You notice a bad smell coming from an ulcer or wound. Document Released: 11/13/2000 Document Revised: 07/19/2013 Document Reviewed: 04/25/2013 Exit[employer] Patient Information 2014 [employer].  

## 2013-10-24 LAB — NMR, LIPOPROFILE
Cholesterol: 136 mg/dL (ref <200)
HDL Cholesterol by NMR: 45 mg/dL (ref >=40)
HDL Particle Number: 29 umol/L — ABNORMAL LOW (ref >=30.5)
LDL Particle Number: 1193 nmol/L — ABNORMAL HIGH (ref <1000)
LDL Size: 20.5 nm — ABNORMAL LOW (ref >20.5)
LDLC SERPL CALC-MCNC: 70 mg/dL (ref <100)
LP-IR Score: 29 (ref <=45)
Small LDL Particle Number: 623 nmol/L — ABNORMAL HIGH (ref <=527)
Triglycerides by NMR: 103 mg/dL (ref <150)

## 2013-10-24 LAB — CMP14+EGFR
ALT: 11 IU/L (ref 0–32)
AST: 16 IU/L (ref 0–40)
Albumin/Globulin Ratio: 2.4 (ref 1.1–2.5)
Albumin: 4.5 g/dL (ref 3.5–4.8)
Alkaline Phosphatase: 60 IU/L (ref 39–117)
BUN/Creatinine Ratio: 18 (ref 11–26)
BUN: 11 mg/dL (ref 8–27)
CO2: 26 mmol/L (ref 18–29)
Calcium: 9.5 mg/dL (ref 8.6–10.2)
Chloride: 103 mmol/L (ref 97–108)
Creatinine, Ser: 0.61 mg/dL (ref 0.57–1.00)
GFR calc Af Amer: 105 mL/min/{1.73_m2} (ref >59)
GFR calc non Af Amer: 91 mL/min/{1.73_m2} (ref >59)
Globulin, Total: 1.9 g/dL (ref 1.5–4.5)
Glucose: 80 mg/dL (ref 65–99)
Potassium: 4.5 mmol/L (ref 3.5–5.2)
Sodium: 141 mmol/L (ref 134–144)
Total Bilirubin: 0.2 mg/dL (ref 0.0–1.2)
Total Protein: 6.4 g/dL (ref 6.0–8.5)

## 2013-12-04 DIAGNOSIS — I6529 Occlusion and stenosis of unspecified carotid artery: Secondary | ICD-10-CM | POA: Diagnosis not present

## 2013-12-12 DIAGNOSIS — I739 Peripheral vascular disease, unspecified: Secondary | ICD-10-CM | POA: Diagnosis not present

## 2013-12-20 DIAGNOSIS — I6529 Occlusion and stenosis of unspecified carotid artery: Secondary | ICD-10-CM | POA: Diagnosis not present

## 2013-12-20 DIAGNOSIS — I70219 Atherosclerosis of native arteries of extremities with intermittent claudication, unspecified extremity: Secondary | ICD-10-CM | POA: Diagnosis not present

## 2013-12-20 DIAGNOSIS — I658 Occlusion and stenosis of other precerebral arteries: Secondary | ICD-10-CM | POA: Diagnosis not present

## 2013-12-20 DIAGNOSIS — I10 Essential (primary) hypertension: Secondary | ICD-10-CM | POA: Diagnosis not present

## 2014-01-05 ENCOUNTER — Other Ambulatory Visit: Payer: Self-pay | Admitting: Family Medicine

## 2014-02-06 ENCOUNTER — Other Ambulatory Visit: Payer: Self-pay

## 2014-02-06 DIAGNOSIS — Z1231 Encounter for screening mammogram for malignant neoplasm of breast: Secondary | ICD-10-CM

## 2014-02-20 ENCOUNTER — Ambulatory Visit: Payer: Medicare Other | Admitting: Family Medicine

## 2014-02-22 ENCOUNTER — Ambulatory Visit
Admission: RE | Admit: 2014-02-22 | Discharge: 2014-02-22 | Disposition: A | Discharge status: Home / Self Care | Payer: Medicare Other | Source: Ambulatory Visit

## 2014-02-22 DIAGNOSIS — Z1231 Encounter for screening mammogram for malignant neoplasm of breast: Secondary | ICD-10-CM

## 2014-02-23 NOTE — Progress Notes (Signed)
Quick Note:  Call patient. Mammogram normal. No change in plan. ______

## 2014-03-20 ENCOUNTER — Encounter: Payer: Self-pay | Admitting: Family Medicine

## 2014-03-20 ENCOUNTER — Ambulatory Visit (INDEPENDENT_AMBULATORY_CARE_PROVIDER_SITE_OTHER): Payer: Medicare Other | Admitting: Family Medicine

## 2014-03-20 VITALS — BP 131/72 | HR 71 | Temp 97.7°F | Ht 63.0 in | Wt 138.8 lb

## 2014-03-20 DIAGNOSIS — I10 Essential (primary) hypertension: Secondary | ICD-10-CM

## 2014-03-20 DIAGNOSIS — E785 Hyperlipidemia, unspecified: Secondary | ICD-10-CM

## 2014-03-20 DIAGNOSIS — E119 Type 2 diabetes mellitus without complications: Secondary | ICD-10-CM

## 2014-03-20 DIAGNOSIS — I739 Peripheral vascular disease, unspecified: Secondary | ICD-10-CM | POA: Diagnosis not present

## 2014-03-20 LAB — POCT GLYCOSYLATED HEMOGLOBIN (HGB A1C): Hemoglobin A1C: 5.8

## 2014-03-20 MED ORDER — AMLODIPINE BESYLATE 10 MG PO TABS: ORAL_TABLET | ORAL | Status: AC

## 2014-03-20 MED ORDER — CLOPIDOGREL BISULFATE 75 MG PO TABS: ORAL_TABLET | ORAL | Status: DC

## 2014-03-20 MED ORDER — METFORMIN HCL 500 MG PO TABS: ORAL_TABLET | ORAL | Status: AC

## 2014-03-20 MED ORDER — LOSARTAN POTASSIUM 25 MG PO TABS: 25.0000 mg | ORAL_TABLET | Freq: Every day | ORAL | Status: AC

## 2014-03-20 MED ORDER — MUPIROCIN 2 % EX OINT: 1.0000 "application " | TOPICAL_OINTMENT | Freq: Three times a day (TID) | CUTANEOUS | Status: AC

## 2014-03-20 MED ORDER — CARVEDILOL 3.125 MG PO TABS: 3.1250 mg | ORAL_TABLET | Freq: Two times a day (BID) | ORAL | Status: AC

## 2014-03-20 MED ORDER — EZETIMIBE-SIMVASTATIN 10-20 MG PO TABS: ORAL_TABLET | ORAL | Status: DC

## 2014-03-20 NOTE — Progress Notes (Signed)
Patient ID: Diana Garcia, female   DOB: [birth year], 73 y.o.   MRN: [id number] SUBJECTIVE: CC: Chief Complaint  Patient presents with  . Follow-up    4 month follow up chronic problems. ck left lower leg states while cleaning bathroom she was standing up in chaira and pin broke from back of chair and stuck her right lower leg   . Medication Refill    needs refills all meds to mail order    HPI:   Patient is here for follow up of Diabetes Mellitus/HLD/HTN/PVD: Symptoms evaluated: Denies Nocturia ,Denies Urinary Frequency , denies Blurred vision ,deniesDizziness,denies.Dysuria,denies paresthesias, denies extremity pain or ulcers.Marland Kitchendenies chest pain. has had an annual eye exam. do check the feet. Does check CBGs. Average DGL:OVFIEP Denies episodes of hypoglycemia. Does have an emergency hypoglycemic plan. admits toCompliance with medications. Denies Problems with medications.  A bit of family stress. Her daughter - in -law was recently involve in a accident returning to [place] from [place] and she crashed and hit a tree and was killed.   Past Medical History  Diagnosis Date  . Closed fracture of fifth metatarsal bone   . Hyperlipidemia   . Hypertension   . Insomnia   . Diabetes mellitus without complication   . Insomnia    No past surgical history on file. History   Social History  . Marital Status: Married    Spouse Name: N/A    Number of Children: N/A  . Years of Education: N/A   Occupational History  . Not on file.   Social History Main Topics  . Smoking status: Never Smoker   . Smokeless tobacco: Not on file  . Alcohol Use: No  . Drug Use: No  . Sexual Activity: Not on file   Other Topics Concern  . Not on file   Social History Narrative  . No narrative on file   Family History  Problem Relation Age of Onset  . Hyperlipidemia Mother   . Hypertension Mother   . Diabetes Father   . Heart attack Father   . Diabetes Sister   . Hypertension Sister   .  Diabetes Brother   . Sudden death Neg Hx    Current Outpatient Prescriptions on File Prior to Visit  Medication Sig Dispense Refill  . aspirin EC 81 MG tablet Take 81 mg by mouth daily.      . Calcium Carbonate-Vitamin D (CALCIUM 600 + D PO) Take 1 tablet by mouth 3 (three) times a week. No specific days      . Cholecalciferol (VITAMIN D PO) Take 4,000 Units by mouth 3 (three) times a week. No specific days      . niacin 500 MG tablet Take 500 mg by mouth at bedtime.      Marland Kitchen zolpidem (AMBIEN) 10 MG tablet Take 10 mg by mouth at bedtime as needed.        No current facility-administered medications on file prior to visit.   Allergies  Allergen Reactions  . Lisinopril     fainting  . Penicillins Hives   Immunization History  Administered Date(s) Administered  . Pneumococcal-Unspecified 11/30/2006  . Tdap 11/30/2004   Prior to Admission medications   Medication Sig Start Date End Date Taking? Authorizing Provider  amLODipine (NORVASC) 10 MG tablet Take 1 tablet by mouth  daily 01/05/14   Chipper Herb, MD  aspirin EC 81 MG tablet Take 81 mg by mouth daily.    Historical Provider, MD  Calcium Carbonate-Vitamin D (CALCIUM  600 + D PO) Take 1 tablet by mouth 3 (three) times a week. No specific days    Historical Provider, MD  carvedilol (COREG) 3.125 MG tablet Take 1 tablet (3.125 mg total) by mouth 2 (two) times daily with a meal. 10/23/13   Vernie Shanks, MD  Cholecalciferol (VITAMIN D PO) Take 4,000 Units by mouth 3 (three) times a week. No specific days    Historical Provider, MD  clopidogrel (PLAVIX) 75 MG tablet Take 1 tablet by mouth  daily 01/05/14   Chipper Herb, MD  losartan (COZAAR) 25 MG tablet  06/08/13   Historical Provider, MD  metFORMIN (GLUCOPHAGE) 500 MG tablet Take 1 tablet by mouth  twice a day with meals 01/05/14   Chipper Herb, MD  niacin 500 MG tablet Take 500 mg by mouth at bedtime.    Historical Provider, MD  VYTORIN 10-20 MG per tablet Take 1 tablet by mouth  daily  at bedtime 01/05/14   Chipper Herb, MD  zolpidem (AMBIEN) 10 MG tablet Take 10 mg by mouth at bedtime as needed.  03/17/13   Historical Provider, MD     ROS: As above in the HPI. All other systems are stable or negative.  OBJECTIVE: APPEARANCE:  Patient in no acute distress.The patient appeared well nourished and normally developed. Acyanotic. Waist: VITAL SIGNS:BP 131/72  Pulse 71  Temp(Src) 97.7 F (36.5 C) (Oral)  Ht 5' 3"  (1.6 m)  Wt 138 lb 12.8 oz (62.959 kg)  BMI 24.59 kg/m2 [place] lady  SKIN: warm and  Dry without overt rashes, tattoos and scars  HEAD and Neck: without JVD, Head and scalp: normal Eyes:No scleral icterus. Fundi normal, eye movements normal. Ears: Auricle normal, canal normal, Tympanic membranes normal, insufflation normal. Nose: normal Throat: normal Neck & thyroid: normal  CHEST & LUNGS: Chest wall: normal Lungs: Clear  CVS: Reveals the PMI to be normally located. Regular rhythm, First and Second Heart sounds are normal,  absence of murmurs, rubs or gallops. Peripheral vasculature: Radial pulses: normal Dorsal pedis pulses: normal Posterior pulses: normal  ABDOMEN:  Appearance: normal Benign, no organomegaly, no masses, no Abdominal Aortic enlargement. No Guarding , no rebound. No Bruits. Bowel sounds: normal  RECTAL: N/A GU: N/A  EXTREMETIES: nonedematous.  MUSCULOSKELETAL:  Spine: normal Joints: intact  NEUROLOGIC: oriented to time,place and person; nonfocal. Strength is normal Sensory is normal Reflexes are normal Cranial Nerves are normal. DM foot check was intact , except for weak PT pulses.   ASSESSMENT: Type II or unspecified type diabetes mellitus without mention of complication, not stated as uncontrolled - Plan: CMP14+EGFR, POCT glycosylated hemoglobin (Hb A1C)  HTN (hypertension) - Plan: CMP14+EGFR  Hyperlipidemia - Plan: CMP14+EGFR, Lipid panel  Claudication in peripheral vascular disease  PLAN: Dash  diet.  Orders Placed This Encounter  Procedures  . CMP14+EGFR  . Lipid panel  . POCT glycosylated hemoglobin (Hb A1C)   Meds ordered this encounter  Medications  . mupirocin ointment (BACTROBAN) 2 %    Sig: Apply 1 application topically 3 (three) times daily.    Dispense:  30 g    Refill:  0  . ezetimibe-simvastatin (VYTORIN) 10-20 MG per tablet    Sig: Take 1 tablet by mouth  daily at bedtime    Dispense:  90 tablet    Refill:  3  . amLODipine (NORVASC) 10 MG tablet    Sig: Take 1 tablet by mouth  daily    Dispense:  90 tablet  Refill:  3  . carvedilol (COREG) 3.125 MG tablet    Sig: Take 1 tablet (3.125 mg total) by mouth 2 (two) times daily with a meal.    Dispense:  180 tablet    Refill:  3  . clopidogrel (PLAVIX) 75 MG tablet    Sig: Take 1 tablet by mouth  daily    Dispense:  90 tablet    Refill:  3  . losartan (COZAAR) 25 MG tablet    Sig: Take 1 tablet (25 mg total) by mouth daily.    Dispense:  90 tablet    Refill:  3  . metFORMIN (GLUCOPHAGE) 500 MG tablet    Sig: Take 1 tablet by mouth  twice a day with meals    Dispense:  180 tablet    Refill:  3   Medications Discontinued During This Encounter  Medication Reason  . VYTORIN 10-20 MG per tablet Reorder  . amLODipine (NORVASC) 10 MG tablet Reorder  . carvedilol (COREG) 3.125 MG tablet Reorder  . clopidogrel (PLAVIX) 75 MG tablet Reorder  . losartan (COZAAR) 25 MG tablet Reorder  . metFORMIN (GLUCOPHAGE) 500 MG tablet Reorder   Return in about 4 months (around 07/20/2014) for Recheck medical problems.  Adylee Leonardo P. Jacelyn Grip, M.D.

## 2014-03-20 NOTE — Patient Instructions (Signed)
DASH Diet  The DASH diet stands for "Dietary Approaches to Stop Hypertension." It is a healthy eating plan that has been shown to reduce high blood pressure (hypertension) in as little as 14 days, while also possibly providing other significant health benefits. These other health benefits include reducing the risk of breast cancer after menopause and reducing the risk of type 2 diabetes, heart disease, colon cancer, and stroke. Health benefits also include weight loss and slowing kidney failure in patients with chronic kidney disease.   DIET GUIDELINES  · Limit salt (sodium). Your diet should contain less than 1500 mg of sodium daily.  · Limit refined or processed carbohydrates. Your diet should include mostly whole grains. Desserts and added sugars should be used sparingly.  · Include small amounts of heart-healthy fats. These types of fats include nuts, oils, and tub margarine. Limit saturated and trans fats. These fats have been shown to be harmful in the body.  CHOOSING FOODS   The following food groups are based on a 2000 calorie diet. See your Registered Dietitian for individual calorie needs.  Grains and Grain Products (6 to 8 servings daily)  · Eat More Often: Whole-wheat bread, brown rice, whole-grain or wheat pasta, quinoa, popcorn without added fat or salt (air popped).  · Eat Less Often: White bread, white pasta, white rice, cornbread.  Vegetables (4 to 5 servings daily)  · Eat More Often: Fresh, frozen, and canned vegetables. Vegetables may be raw, steamed, roasted, or grilled with a minimal amount of fat.  · Eat Less Often/Avoid: Creamed or fried vegetables. Vegetables in a cheese sauce.  Fruit (4 to 5 servings daily)  · Eat More Often: All fresh, canned (in natural juice), or frozen fruits. Dried fruits without added sugar. One hundred percent fruit juice (½ cup [237 mL] daily).  · Eat Less Often: Dried fruits with added sugar. Canned fruit in light or heavy syrup.  Lean Meats, Fish, and Poultry (2  servings or less daily. One serving is 3 to 4 oz [85-114 g]).  · Eat More Often: Ninety percent or leaner ground beef, tenderloin, sirloin. Round cuts of beef, chicken breast, turkey breast. All fish. Grill, bake, or broil your meat. Nothing should be fried.  · Eat Less Often/Avoid: Fatty cuts of meat, turkey, or chicken leg, thigh, or wing. Fried cuts of meat or fish.  Dairy (2 to 3 servings)  · Eat More Often: Low-fat or fat-free milk, low-fat plain or light yogurt, reduced-fat or part-skim cheese.  · Eat Less Often/Avoid: Milk (whole, 2%). Whole milk yogurt. Full-fat cheeses.  Nuts, Seeds, and Legumes (4 to 5 servings per week)  · Eat More Often: All without added salt.  · Eat Less Often/Avoid: Salted nuts and seeds, canned beans with added salt.  Fats and Sweets (limited)  · Eat More Often: Vegetable oils, tub margarines without trans fats, sugar-free gelatin. Mayonnaise and salad dressings.  · Eat Less Often/Avoid: Coconut oils, palm oils, butter, stick margarine, cream, half and half, cookies, candy, pie.  FOR MORE INFORMATION  The Dash Diet Eating Plan: www.dashdiet.org  Document Released: 11/05/2011 Document Revised: 02/08/2012 Document Reviewed: 11/05/2011  ExitCare® Patient Information ©2014 [employer].

## 2014-03-21 LAB — CMP14+EGFR
ALT: 10 IU/L (ref 0–32)
AST: 18 IU/L (ref 0–40)
Albumin/Globulin Ratio: 2.5 (ref 1.1–2.5)
Albumin: 4.8 g/dL (ref 3.5–4.8)
Alkaline Phosphatase: 62 IU/L (ref 39–117)
BUN/Creatinine Ratio: 21 (ref 11–26)
BUN: 14 mg/dL (ref 8–27)
CO2: 23 mmol/L (ref 18–29)
Calcium: 9.9 mg/dL (ref 8.7–10.3)
Chloride: 100 mmol/L (ref 97–108)
Creatinine, Ser: 0.66 mg/dL (ref 0.57–1.00)
GFR calc Af Amer: 102 mL/min/{1.73_m2} (ref >59)
GFR calc non Af Amer: 89 mL/min/{1.73_m2} (ref >59)
Globulin, Total: 1.9 g/dL (ref 1.5–4.5)
Glucose: 77 mg/dL (ref 65–99)
Potassium: 4 mmol/L (ref 3.5–5.2)
Sodium: 140 mmol/L (ref 134–144)
Total Bilirubin: 0.3 mg/dL (ref 0.0–1.2)
Total Protein: 6.7 g/dL (ref 6.0–8.5)

## 2014-03-21 LAB — LIPID PANEL
Chol/HDL Ratio: 2.8 ratio units (ref 0.0–4.4)
Cholesterol, Total: 146 mg/dL (ref 100–199)
HDL: 52 mg/dL (ref >39)
LDL Calculated: 79 mg/dL (ref 0–99)
Triglycerides: 74 mg/dL (ref 0–149)
VLDL Cholesterol Cal: 15 mg/dL (ref 5–40)

## 2014-03-22 ENCOUNTER — Telehealth: Payer: Self-pay | Admitting: Family Medicine

## 2014-03-22 NOTE — Telephone Encounter (Signed)
Message copied by Azalee CourseFULP, Marge Vandermeulen on Thu Mar 22, 2014  9:41 AM ------      Message from: Ileana LaddWONG, FRANCIS P      Created: Thu Mar 22, 2014  5:24 AM       Call Patient      Lab result at goal. If the HGBA1C at her next visit continues to be lower than the goal of 6.4, we may need to reduce the dose of metformin.      No change in Medications for now.      No Change in recommendations.      No change in plans for follow up. ------

## 2014-04-03 NOTE — Telephone Encounter (Signed)
Pt aware of lab results.  rs °

## 2014-06-07 DIAGNOSIS — I6529 Occlusion and stenosis of unspecified carotid artery: Secondary | ICD-10-CM | POA: Diagnosis not present

## 2014-09-17 DIAGNOSIS — I10 Essential (primary) hypertension: Secondary | ICD-10-CM | POA: Diagnosis not present

## 2014-09-17 DIAGNOSIS — E785 Hyperlipidemia, unspecified: Secondary | ICD-10-CM | POA: Diagnosis not present

## 2014-09-17 DIAGNOSIS — E119 Type 2 diabetes mellitus without complications: Secondary | ICD-10-CM | POA: Diagnosis not present

## 2014-11-01 DIAGNOSIS — I10 Essential (primary) hypertension: Secondary | ICD-10-CM | POA: Diagnosis not present

## 2014-11-01 DIAGNOSIS — E119 Type 2 diabetes mellitus without complications: Secondary | ICD-10-CM | POA: Diagnosis not present

## 2014-11-01 DIAGNOSIS — Z0000 Encounter for general adult medical examination without abnormal findings: Secondary | ICD-10-CM | POA: Diagnosis not present

## 2014-11-01 DIAGNOSIS — I739 Peripheral vascular disease, unspecified: Secondary | ICD-10-CM | POA: Diagnosis not present

## 2014-11-01 DIAGNOSIS — E785 Hyperlipidemia, unspecified: Secondary | ICD-10-CM | POA: Diagnosis not present

## 2014-11-08 ENCOUNTER — Encounter (HOSPITAL_COMMUNITY): Payer: Self-pay | Admitting: Cardiology

## 2014-12-04 DIAGNOSIS — I6529 Occlusion and stenosis of unspecified carotid artery: Secondary | ICD-10-CM | POA: Diagnosis not present

## 2014-12-19 DIAGNOSIS — E780 Pure hypercholesterolemia: Secondary | ICD-10-CM | POA: Diagnosis not present

## 2014-12-19 DIAGNOSIS — I6523 Occlusion and stenosis of bilateral carotid arteries: Secondary | ICD-10-CM | POA: Diagnosis not present

## 2014-12-19 DIAGNOSIS — I70212 Atherosclerosis of native arteries of extremities with intermittent claudication, left leg: Secondary | ICD-10-CM | POA: Diagnosis not present

## 2014-12-19 DIAGNOSIS — I10 Essential (primary) hypertension: Secondary | ICD-10-CM | POA: Diagnosis not present

## 2015-01-24 ENCOUNTER — Other Ambulatory Visit: Payer: Self-pay

## 2015-01-24 DIAGNOSIS — Z1231 Encounter for screening mammogram for malignant neoplasm of breast: Secondary | ICD-10-CM

## 2015-02-26 ENCOUNTER — Ambulatory Visit
Admission: RE | Admit: 2015-02-26 | Discharge: 2015-02-26 | Disposition: A | Discharge status: Home / Self Care | Payer: Medicare Other | Source: Ambulatory Visit

## 2015-02-26 DIAGNOSIS — Z1231 Encounter for screening mammogram for malignant neoplasm of breast: Secondary | ICD-10-CM

## 2015-02-28 ENCOUNTER — Encounter: Payer: Self-pay | Admitting: Obstetrics

## 2015-02-28 ENCOUNTER — Ambulatory Visit (INDEPENDENT_AMBULATORY_CARE_PROVIDER_SITE_OTHER): Payer: Medicare Other | Admitting: Obstetrics

## 2015-02-28 VITALS — BP 113/68 | HR 65 | Temp 98.6°F | Ht 63.0 in | Wt 141.0 lb

## 2015-02-28 DIAGNOSIS — L989 Disorder of the skin and subcutaneous tissue, unspecified: Secondary | ICD-10-CM | POA: Diagnosis not present

## 2015-02-28 MED ORDER — CLOBETASOL PROPIONATE 0.05 % EX CREA: 1.0000 "application " | TOPICAL_CREAM | Freq: Two times a day (BID) | CUTANEOUS | Status: DC

## 2015-02-28 NOTE — Progress Notes (Signed)
Patient ID: Diana Garcia, female   DOB: 08/11/1941, 74 y.o.   MRN: [id number]  Chief Complaint  Patient presents with  . Vaginitis    HPI Diana IdolBeverly Felkins is a 74 y.o. female.  Vulva itching.  Per patient, she has had this itching in this area before and Dr. Tina GriffithsJ-M obtained biopsies that revealed Lichen Sclerosis.  She was treated successfully with Clobetasol.  She feels that these symptoms and location now are the same as in the past. HPI  Past Medical History  Diagnosis Date  . Closed fracture of fifth metatarsal bone   . Hyperlipidemia   . Hypertension   . Insomnia   . Diabetes mellitus without complication   . Insomnia     Past Surgical History  Procedure Laterality Date  . Lower extremity angiogram N/A 05/16/2012    Procedure: LOWER EXTREMITY ANGIOGRAM;  Surgeon: Pamella PertJagadeesh R Ganji, MD;  [facility];  Service: Cardiovascular;  Laterality: N/A;    Family History  Problem Relation Age of Onset  . Hyperlipidemia Mother   . Hypertension Mother   . Diabetes Father   . Heart attack Father   . Diabetes Sister   . Hypertension Sister   . Diabetes Brother   . Sudden death Neg Hx     Social History History  Substance Use Topics  . Smoking status: Never Smoker   . Smokeless tobacco: Not on file  . Alcohol Use: 0.0 oz/week    0 Standard drinks or equivalent per week     Comment: Rarely    Allergies  Allergen Reactions  . Lisinopril     fainting  . Penicillins Hives    Current Outpatient Prescriptions  Medication Sig Dispense Refill  . amLODipine (NORVASC) 10 MG tablet Take 1 tablet by mouth  daily 90 tablet 3  . Calcium Carbonate-Vitamin D (CALCIUM 600 + D PO) Take 1 tablet by mouth 3 (three) times a week. No specific days    . carvedilol (COREG) 3.125 MG tablet Take 1 tablet (3.125 mg total) by mouth 2 (two) times daily with a meal. 180 tablet 3  . Cholecalciferol (VITAMIN D PO) Take 4,000 Units by mouth 3 (three) times a week. No specific days    .  clopidogrel (PLAVIX) 75 MG tablet Take 1 tablet by mouth  daily 90 tablet 3  . ezetimibe-simvastatin (VYTORIN) 10-20 MG per tablet Take 1 tablet by mouth  daily at bedtime 90 tablet 3  . losartan (COZAAR) 25 MG tablet Take 1 tablet (25 mg total) by mouth daily. 90 tablet 3  . metFORMIN (GLUCOPHAGE) 500 MG tablet Take 1 tablet by mouth  twice a day with meals 180 tablet 3  . niacin 500 MG tablet Take 500 mg by mouth at bedtime.    Marland Kitchen. aspirin EC 81 MG tablet Take 81 mg by mouth daily.    . clobetasol cream (TEMOVATE) 0.05 % Apply 1 application topically 2 (two) times daily. 60 g 1  . zolpidem (AMBIEN) 10 MG tablet Take 10 mg by mouth at bedtime as needed.      No current facility-administered medications for this visit.    Review of Systems Review of Systems Constitutional: negative for fatigue and weight loss Respiratory: negative for cough and wheezing Cardiovascular: negative for chest pain, fatigue and palpitations Gastrointestinal: negative for abdominal pain and change in bowel habits Genitourinary: vulva itching Integument/breast: negative for nipple discharge Musculoskeletal:negative for myalgias Neurological: negative for gait problems and tremors Behavioral/Psych: negative for abusive relationship, depression  Endocrine: negative for temperature intolerance     Blood pressure 113/68, pulse 65, temperature 98.6 F (37 C), height  (1.6 m), weight 141 lb (63.957 kg).  Physical Exam Physical Exam                   Pelvis:  External genitalia:  Depigmented labia minora Urinary system: urethral meatus normal and bladder without fullness, nontender Vaginal: normal without tenderness, induration or masses Cervix: normal appearance       Data Reviewed Labs  Assessment     Vulva pruritic dermatosis.  Probably Lichen Sclerosis.     Plan    Clobetasol Rx F/U prn   No orders of the defined types were placed in this encounter.   Meds ordered this encounter   Medications  . clobetasol cream (TEMOVATE) 0.05 %    Sig: Apply 1 application topically 2 (two) times daily.    Dispense:  60 g    Refill:  1

## 2015-05-14 DIAGNOSIS — E785 Hyperlipidemia, unspecified: Secondary | ICD-10-CM | POA: Diagnosis not present

## 2015-05-14 DIAGNOSIS — I10 Essential (primary) hypertension: Secondary | ICD-10-CM | POA: Diagnosis not present

## 2015-05-14 DIAGNOSIS — E119 Type 2 diabetes mellitus without complications: Secondary | ICD-10-CM | POA: Diagnosis not present

## 2015-05-14 DIAGNOSIS — I739 Peripheral vascular disease, unspecified: Secondary | ICD-10-CM | POA: Diagnosis not present

## 2015-05-14 DIAGNOSIS — M7551 Bursitis of right shoulder: Secondary | ICD-10-CM | POA: Diagnosis not present

## 2015-06-11 DIAGNOSIS — I6523 Occlusion and stenosis of bilateral carotid arteries: Secondary | ICD-10-CM | POA: Diagnosis not present

## 2015-08-27 DIAGNOSIS — E785 Hyperlipidemia, unspecified: Secondary | ICD-10-CM | POA: Diagnosis not present

## 2015-08-27 DIAGNOSIS — I739 Peripheral vascular disease, unspecified: Secondary | ICD-10-CM | POA: Diagnosis not present

## 2015-08-27 DIAGNOSIS — E119 Type 2 diabetes mellitus without complications: Secondary | ICD-10-CM | POA: Diagnosis not present

## 2015-08-27 DIAGNOSIS — I10 Essential (primary) hypertension: Secondary | ICD-10-CM | POA: Diagnosis not present

## 2015-09-10 ENCOUNTER — Encounter: Payer: Self-pay | Admitting: Obstetrics

## 2015-09-10 ENCOUNTER — Ambulatory Visit (INDEPENDENT_AMBULATORY_CARE_PROVIDER_SITE_OTHER): Payer: Medicare Other | Admitting: Obstetrics

## 2015-09-10 VITALS — BP 106/67 | HR 68 | Ht 63.0 in | Wt 139.0 lb

## 2015-09-10 DIAGNOSIS — L989 Disorder of the skin and subcutaneous tissue, unspecified: Secondary | ICD-10-CM

## 2015-09-10 DIAGNOSIS — N760 Acute vaginitis: Secondary | ICD-10-CM | POA: Diagnosis not present

## 2015-09-10 DIAGNOSIS — Z01419 Encounter for gynecological examination (general) (routine) without abnormal findings: Secondary | ICD-10-CM

## 2015-09-10 DIAGNOSIS — Z124 Encounter for screening for malignant neoplasm of cervix: Secondary | ICD-10-CM

## 2015-09-10 DIAGNOSIS — G4700 Insomnia, unspecified: Secondary | ICD-10-CM

## 2015-09-10 MED ORDER — MOMETASONE FUROATE 0.1 % EX CREA: 1.0000 "application " | TOPICAL_CREAM | Freq: Every day | CUTANEOUS | Status: DC

## 2015-09-10 MED ORDER — ZOLPIDEM TARTRATE 5 MG PO TABS: 5.0000 mg | ORAL_TABLET | Freq: Every evening | ORAL | Status: DC | PRN

## 2015-09-10 NOTE — Progress Notes (Signed)
Subjective:        Diana Garcia is a 74 y.o. female here for a routine exam.  Current complaints: None.    Personal health questionnaire:  Is patient Ashkenazi Jewish, have a family history of breast and/or ovarian cancer: no Is there a family history of uterine cancer diagnosed at age < 4, gastrointestinal cancer, urinary tract cancer, family member who is a Personnel officer syndrome-associated carrier: no Is the patient overweight and hypertensive, family history of diabetes, personal history of gestational diabetes, preeclampsia or PCOS: no Is patient over 77, have PCOS,  family history of premature CHD under age 57, diabetes, smoke, have hypertension or peripheral artery disease:  no At any time, has a partner hit, kicked or otherwise hurt or frightened you?: no Over the past 2 weeks, have you felt down, depressed or hopeless?: no Over the past 2 weeks, have you felt little interest or pleasure in doing things?:no   Gynecologic History No LMP recorded. Patient is postmenopausal. Contraception: post menopausal status Last Pap: 2014. Results were: normal Last mammogram: 2016. Results were: normal  Obstetric History OB History  No data available    Past Medical History  Diagnosis Date  . Closed fracture of fifth metatarsal bone   . Hyperlipidemia   . Hypertension   . Insomnia   . Diabetes mellitus without complication (HCC)   . Insomnia     Past Surgical History  Procedure Laterality Date  . Lower extremity angiogram N/A 05/16/2012    Procedure: LOWER EXTREMITY ANGIOGRAM;  Surgeon: Pamella Pert, MD;  Location: [facility] CATH LAB;  Service: Cardiovascular;  Laterality: N/A;     Current outpatient prescriptions:  .  amLODipine (NORVASC) 10 MG tablet, Take 1 tablet by mouth  daily, Disp: 90 tablet, Rfl: 3 .  aspirin EC 81 MG tablet, Take 81 mg by mouth daily., Disp: , Rfl:  .  Calcium Carbonate-Vitamin D (CALCIUM 600 + D PO), Take 1 tablet by mouth 3 (three) times a week. No  specific days, Disp: , Rfl:  .  carvedilol (COREG) 3.125 MG tablet, Take 1 tablet (3.125 mg total) by mouth 2 (two) times daily with a meal., Disp: 180 tablet, Rfl: 3 .  clobetasol cream (TEMOVATE) 0.05 %, Apply 1 application topically 2 (two) times daily., Disp: 60 g, Rfl: 1 .  clopidogrel (PLAVIX) 75 MG tablet, Take 1 tablet by mouth  daily, Disp: 90 tablet, Rfl: 3 .  losartan (COZAAR) 25 MG tablet, Take 1 tablet (25 mg total) by mouth daily., Disp: 90 tablet, Rfl: 3 .  metFORMIN (GLUCOPHAGE) 500 MG tablet, Take 1 tablet by mouth  twice a day with meals, Disp: 180 tablet, Rfl: 3 .  niacin 500 MG tablet, Take 500 mg by mouth at bedtime., Disp: , Rfl:  .  rosuvastatin (CRESTOR) 20 MG tablet, Take 20 mg by mouth daily., Disp: , Rfl:  .  Cholecalciferol (VITAMIN D PO), Take 4,000 Units by mouth 3 (three) times a week. No specific days, Disp: , Rfl:  .  mometasone (ELOCON) 0.1 % cream, Apply 1 application topically daily., Disp: 50 g, Rfl: 2 .  zolpidem (AMBIEN) 5 MG tablet, Take 1 tablet (5 mg total) by mouth at bedtime as needed for sleep., Disp: 30 tablet, Rfl: 0 Allergies  Allergen Reactions  . Lisinopril     fainting  . Penicillins Hives    Social History  Substance Use Topics  . Smoking status: Never Smoker   . Smokeless tobacco: Not on file  .  Alcohol Use: 0.0 oz/week    0 Standard drinks or equivalent per week     Comment: Rarely    Family History  Problem Relation Age of Onset  . Hyperlipidemia Mother   . Hypertension Mother   . Diabetes Father   . Heart attack Father   . Diabetes Sister   . Hypertension Sister   . Diabetes Brother   . Sudden death Neg Hx       Review of Systems  Constitutional: negative for fatigue and weight loss Respiratory: negative for cough and wheezing Cardiovascular: negative for chest pain, fatigue and palpitations Gastrointestinal: negative for abdominal pain and change in bowel habits Musculoskeletal:negative for myalgias Neurological:  negative for gait problems and tremors Behavioral/Psych: negative for abusive relationship, depression Endocrine: negative for temperature intolerance   Genitourinary: positive for vaginal itching Integument/breast: negative for breast lump, breast tenderness, nipple discharge and skin lesion(s)    Objective:       BP 106/67 mmHg  Pulse 68  Ht  (1.6 m)  Wt 139 lb (63.05 kg)  BMI 24.63 kg/m2 General:   alert  Skin:   no rash or abnormalities  Lungs:   clear to auscultation bilaterally  Heart:   regular rate and rhythm, S1, S2 normal, no murmur, click, rub or gallop  Breasts:   normal without suspicious masses, skin or nipple changes or axillary nodes  Abdomen:  normal findings: no organomegaly, soft, non-tender and no hernia  Pelvis:  External genitalia: normal general appearance Urinary system: urethral meatus normal and bladder without fullness, nontender Vaginal: normal without tenderness, induration or masses Cervix: normal appearance Adnexa: normal bimanual exam Uterus: anteverted and non-tender, normal size   Lab Review Urine pregnancy test Labs reviewed yes Radiologic studies reviewed yes    Assessment:    Healthy female exam.    H/O Vulva Dermatosis  Insomnia   Plan:   Elocon cream Rx for vulva dermatosis Ambien Rx for insomnia   Education reviewed: low fat, low cholesterol diet, self breast exams and weight bearing exercise. Follow up in: 2 years.   Meds ordered this encounter  Medications  . rosuvastatin (CRESTOR) 20 MG tablet    Sig: Take 20 mg by mouth daily.  . mometasone (ELOCON) 0.1 % cream    Sig: Apply 1 application topically daily.    Dispense:  50 g    Refill:  2  . zolpidem (AMBIEN) 5 MG tablet    Sig: Take 1 tablet (5 mg total) by mouth at bedtime as needed for sleep.    Dispense:  30 tablet    Refill:  0   Orders Placed This Encounter  Procedures  . Bacterial Vaginosis DNA  . Candidiasis, PCR

## 2015-09-11 LAB — PAP IG (IMAGE GUIDED)

## 2015-09-13 LAB — BACTERIAL VAGINOSIS DNA
Atopobium vaginae: NOT DETECTED Log (cells/mL)
GARDNERELLA VAGINALIS: NOT DETECTED Log (cells/mL)
LACTOBACILLUS SPECIES: NOT DETECTED Log (cells/mL)
MEGASPHAERA SPECIES: NOT DETECTED Log (cells/mL)

## 2015-09-13 LAB — CANDIDIASIS, PCR
C. ALBICANS, DNA: NOT DETECTED
C. TROPICALIS, DNA: NOT DETECTED
C. glabrata, DNA: NOT DETECTED
C. parapsilosis, DNA: NOT DETECTED

## 2015-12-17 DIAGNOSIS — I6523 Occlusion and stenosis of bilateral carotid arteries: Secondary | ICD-10-CM | POA: Diagnosis not present

## 2015-12-25 DIAGNOSIS — I10 Essential (primary) hypertension: Secondary | ICD-10-CM | POA: Diagnosis not present

## 2015-12-25 DIAGNOSIS — I6523 Occlusion and stenosis of bilateral carotid arteries: Secondary | ICD-10-CM | POA: Diagnosis not present

## 2015-12-25 DIAGNOSIS — I70212 Atherosclerosis of native arteries of extremities with intermittent claudication, left leg: Secondary | ICD-10-CM | POA: Diagnosis not present

## 2015-12-25 DIAGNOSIS — E7800 Pure hypercholesterolemia, unspecified: Secondary | ICD-10-CM | POA: Diagnosis not present

## 2015-12-27 DIAGNOSIS — I739 Peripheral vascular disease, unspecified: Secondary | ICD-10-CM | POA: Diagnosis not present

## 2015-12-27 DIAGNOSIS — E119 Type 2 diabetes mellitus without complications: Secondary | ICD-10-CM | POA: Diagnosis not present

## 2015-12-27 DIAGNOSIS — Z7902 Long term (current) use of antithrombotics/antiplatelets: Secondary | ICD-10-CM | POA: Diagnosis not present

## 2015-12-27 DIAGNOSIS — Z7984 Long term (current) use of oral hypoglycemic drugs: Secondary | ICD-10-CM | POA: Diagnosis not present

## 2015-12-27 DIAGNOSIS — I10 Essential (primary) hypertension: Secondary | ICD-10-CM | POA: Diagnosis not present

## 2015-12-27 DIAGNOSIS — E785 Hyperlipidemia, unspecified: Secondary | ICD-10-CM | POA: Diagnosis not present

## 2016-01-22 ENCOUNTER — Other Ambulatory Visit: Payer: Self-pay

## 2016-01-22 DIAGNOSIS — Z1231 Encounter for screening mammogram for malignant neoplasm of breast: Secondary | ICD-10-CM

## 2016-02-17 DIAGNOSIS — E119 Type 2 diabetes mellitus without complications: Secondary | ICD-10-CM | POA: Diagnosis not present

## 2016-02-17 DIAGNOSIS — H25013 Cortical age-related cataract, bilateral: Secondary | ICD-10-CM | POA: Diagnosis not present

## 2016-02-17 DIAGNOSIS — H35363 Drusen (degenerative) of macula, bilateral: Secondary | ICD-10-CM | POA: Diagnosis not present

## 2016-02-17 DIAGNOSIS — H2513 Age-related nuclear cataract, bilateral: Secondary | ICD-10-CM | POA: Diagnosis not present

## 2016-02-27 ENCOUNTER — Ambulatory Visit
Admission: RE | Admit: 2016-02-27 | Discharge: 2016-02-27 | Disposition: A | Discharge status: Home / Self Care | Payer: Medicare Other | Source: Ambulatory Visit

## 2016-02-27 DIAGNOSIS — Z1231 Encounter for screening mammogram for malignant neoplasm of breast: Secondary | ICD-10-CM

## 2016-05-08 DIAGNOSIS — I739 Peripheral vascular disease, unspecified: Secondary | ICD-10-CM | POA: Diagnosis not present

## 2016-05-08 DIAGNOSIS — Z7984 Long term (current) use of oral hypoglycemic drugs: Secondary | ICD-10-CM | POA: Diagnosis not present

## 2016-05-08 DIAGNOSIS — K635 Polyp of colon: Secondary | ICD-10-CM | POA: Diagnosis not present

## 2016-05-08 DIAGNOSIS — D649 Anemia, unspecified: Secondary | ICD-10-CM | POA: Diagnosis not present

## 2016-05-08 DIAGNOSIS — Z7902 Long term (current) use of antithrombotics/antiplatelets: Secondary | ICD-10-CM | POA: Diagnosis not present

## 2016-05-08 DIAGNOSIS — I10 Essential (primary) hypertension: Secondary | ICD-10-CM | POA: Diagnosis not present

## 2016-05-08 DIAGNOSIS — E119 Type 2 diabetes mellitus without complications: Secondary | ICD-10-CM | POA: Diagnosis not present

## 2016-05-08 DIAGNOSIS — E785 Hyperlipidemia, unspecified: Secondary | ICD-10-CM | POA: Diagnosis not present

## 2016-08-20 DIAGNOSIS — K5730 Diverticulosis of large intestine without perforation or abscess without bleeding: Secondary | ICD-10-CM | POA: Diagnosis not present

## 2016-08-20 DIAGNOSIS — Z86010 Personal history of colonic polyps: Secondary | ICD-10-CM | POA: Diagnosis not present

## 2016-09-01 DIAGNOSIS — Z7984 Long term (current) use of oral hypoglycemic drugs: Secondary | ICD-10-CM | POA: Diagnosis not present

## 2016-09-01 DIAGNOSIS — I10 Essential (primary) hypertension: Secondary | ICD-10-CM | POA: Diagnosis not present

## 2016-09-01 DIAGNOSIS — E119 Type 2 diabetes mellitus without complications: Secondary | ICD-10-CM | POA: Diagnosis not present

## 2016-09-01 DIAGNOSIS — Z23 Encounter for immunization: Secondary | ICD-10-CM | POA: Diagnosis not present

## 2016-09-01 DIAGNOSIS — E785 Hyperlipidemia, unspecified: Secondary | ICD-10-CM | POA: Diagnosis not present

## 2016-09-01 DIAGNOSIS — D649 Anemia, unspecified: Secondary | ICD-10-CM | POA: Diagnosis not present

## 2016-09-01 DIAGNOSIS — I739 Peripheral vascular disease, unspecified: Secondary | ICD-10-CM | POA: Diagnosis not present

## 2016-12-23 DIAGNOSIS — I10 Essential (primary) hypertension: Secondary | ICD-10-CM | POA: Diagnosis not present

## 2016-12-23 DIAGNOSIS — I70212 Atherosclerosis of native arteries of extremities with intermittent claudication, left leg: Secondary | ICD-10-CM | POA: Diagnosis not present

## 2016-12-23 DIAGNOSIS — I6523 Occlusion and stenosis of bilateral carotid arteries: Secondary | ICD-10-CM | POA: Diagnosis not present

## 2017-01-05 DIAGNOSIS — E785 Hyperlipidemia, unspecified: Secondary | ICD-10-CM | POA: Diagnosis not present

## 2017-01-05 DIAGNOSIS — E119 Type 2 diabetes mellitus without complications: Secondary | ICD-10-CM | POA: Diagnosis not present

## 2017-01-05 DIAGNOSIS — I739 Peripheral vascular disease, unspecified: Secondary | ICD-10-CM | POA: Diagnosis not present

## 2017-01-05 DIAGNOSIS — Z7984 Long term (current) use of oral hypoglycemic drugs: Secondary | ICD-10-CM | POA: Diagnosis not present

## 2017-01-05 DIAGNOSIS — D649 Anemia, unspecified: Secondary | ICD-10-CM | POA: Diagnosis not present

## 2017-01-05 DIAGNOSIS — I10 Essential (primary) hypertension: Secondary | ICD-10-CM | POA: Diagnosis not present

## 2017-01-12 DIAGNOSIS — H2511 Age-related nuclear cataract, right eye: Secondary | ICD-10-CM | POA: Diagnosis not present

## 2017-01-12 DIAGNOSIS — H2513 Age-related nuclear cataract, bilateral: Secondary | ICD-10-CM | POA: Diagnosis not present

## 2017-01-12 DIAGNOSIS — H25013 Cortical age-related cataract, bilateral: Secondary | ICD-10-CM | POA: Diagnosis not present

## 2017-01-12 DIAGNOSIS — H35363 Drusen (degenerative) of macula, bilateral: Secondary | ICD-10-CM | POA: Diagnosis not present

## 2017-01-12 DIAGNOSIS — H35033 Hypertensive retinopathy, bilateral: Secondary | ICD-10-CM | POA: Diagnosis not present

## 2017-01-12 DIAGNOSIS — E119 Type 2 diabetes mellitus without complications: Secondary | ICD-10-CM | POA: Diagnosis not present

## 2017-01-28 HISTORY — PX: CATARACT EXTRACTION, BILATERAL: SHX1313

## 2017-02-03 DIAGNOSIS — H25811 Combined forms of age-related cataract, right eye: Secondary | ICD-10-CM | POA: Diagnosis not present

## 2017-02-03 DIAGNOSIS — H2511 Age-related nuclear cataract, right eye: Secondary | ICD-10-CM | POA: Diagnosis not present

## 2017-02-09 DIAGNOSIS — H2512 Age-related nuclear cataract, left eye: Secondary | ICD-10-CM | POA: Diagnosis not present

## 2017-02-09 DIAGNOSIS — H25012 Cortical age-related cataract, left eye: Secondary | ICD-10-CM | POA: Diagnosis not present

## 2017-02-17 DIAGNOSIS — H25812 Combined forms of age-related cataract, left eye: Secondary | ICD-10-CM | POA: Diagnosis not present

## 2017-02-17 DIAGNOSIS — H2512 Age-related nuclear cataract, left eye: Secondary | ICD-10-CM | POA: Diagnosis not present

## 2017-02-22 ENCOUNTER — Other Ambulatory Visit: Payer: Self-pay | Admitting: Family Medicine

## 2017-02-22 DIAGNOSIS — Z1231 Encounter for screening mammogram for malignant neoplasm of breast: Secondary | ICD-10-CM

## 2017-03-16 ENCOUNTER — Ambulatory Visit
Admission: RE | Admit: 2017-03-16 | Discharge: 2017-03-16 | Disposition: A | Discharge status: Home / Self Care | Payer: Medicare Other | Source: Ambulatory Visit | Attending: Family Medicine | Admitting: Family Medicine

## 2017-03-16 DIAGNOSIS — Z1231 Encounter for screening mammogram for malignant neoplasm of breast: Secondary | ICD-10-CM | POA: Diagnosis not present

## 2017-05-06 DIAGNOSIS — I10 Essential (primary) hypertension: Secondary | ICD-10-CM | POA: Diagnosis not present

## 2017-05-06 DIAGNOSIS — I739 Peripheral vascular disease, unspecified: Secondary | ICD-10-CM | POA: Diagnosis not present

## 2017-05-06 DIAGNOSIS — E119 Type 2 diabetes mellitus without complications: Secondary | ICD-10-CM | POA: Diagnosis not present

## 2017-05-06 DIAGNOSIS — Z7984 Long term (current) use of oral hypoglycemic drugs: Secondary | ICD-10-CM | POA: Diagnosis not present

## 2017-05-06 DIAGNOSIS — D649 Anemia, unspecified: Secondary | ICD-10-CM | POA: Diagnosis not present

## 2017-05-06 DIAGNOSIS — E785 Hyperlipidemia, unspecified: Secondary | ICD-10-CM | POA: Diagnosis not present

## 2017-05-06 DIAGNOSIS — J302 Other seasonal allergic rhinitis: Secondary | ICD-10-CM | POA: Diagnosis not present

## 2017-06-22 DIAGNOSIS — I6523 Occlusion and stenosis of bilateral carotid arteries: Secondary | ICD-10-CM | POA: Diagnosis not present

## 2017-08-25 DIAGNOSIS — Z961 Presence of intraocular lens: Secondary | ICD-10-CM | POA: Diagnosis not present

## 2017-08-25 DIAGNOSIS — H35033 Hypertensive retinopathy, bilateral: Secondary | ICD-10-CM | POA: Diagnosis not present

## 2017-08-25 DIAGNOSIS — H26492 Other secondary cataract, left eye: Secondary | ICD-10-CM | POA: Diagnosis not present

## 2017-08-25 DIAGNOSIS — H35363 Drusen (degenerative) of macula, bilateral: Secondary | ICD-10-CM | POA: Diagnosis not present

## 2017-08-25 DIAGNOSIS — E119 Type 2 diabetes mellitus without complications: Secondary | ICD-10-CM | POA: Diagnosis not present

## 2017-08-25 DIAGNOSIS — H26491 Other secondary cataract, right eye: Secondary | ICD-10-CM | POA: Diagnosis not present

## 2017-09-06 DIAGNOSIS — Z23 Encounter for immunization: Secondary | ICD-10-CM | POA: Diagnosis not present

## 2017-09-06 DIAGNOSIS — I10 Essential (primary) hypertension: Secondary | ICD-10-CM | POA: Diagnosis not present

## 2017-09-06 DIAGNOSIS — I739 Peripheral vascular disease, unspecified: Secondary | ICD-10-CM | POA: Diagnosis not present

## 2017-09-06 DIAGNOSIS — D649 Anemia, unspecified: Secondary | ICD-10-CM | POA: Diagnosis not present

## 2017-09-06 DIAGNOSIS — E119 Type 2 diabetes mellitus without complications: Secondary | ICD-10-CM | POA: Diagnosis not present

## 2017-09-06 DIAGNOSIS — Z7902 Long term (current) use of antithrombotics/antiplatelets: Secondary | ICD-10-CM | POA: Diagnosis not present

## 2017-09-06 DIAGNOSIS — E785 Hyperlipidemia, unspecified: Secondary | ICD-10-CM | POA: Diagnosis not present

## 2017-09-06 DIAGNOSIS — Z7984 Long term (current) use of oral hypoglycemic drugs: Secondary | ICD-10-CM | POA: Diagnosis not present

## 2017-09-16 ENCOUNTER — Encounter: Payer: Self-pay | Admitting: Obstetrics

## 2017-09-16 ENCOUNTER — Ambulatory Visit (INDEPENDENT_AMBULATORY_CARE_PROVIDER_SITE_OTHER): Payer: Medicare Other | Admitting: Obstetrics

## 2017-09-16 ENCOUNTER — Other Ambulatory Visit (HOSPITAL_COMMUNITY)
Admission: RE | Admit: 2017-09-16 | Discharge: 2017-09-16 | Disposition: A | Discharge status: Home / Self Care | Payer: Medicare Other | Source: Ambulatory Visit | Attending: Obstetrics | Admitting: Obstetrics

## 2017-09-16 DIAGNOSIS — F5101 Primary insomnia: Secondary | ICD-10-CM

## 2017-09-16 DIAGNOSIS — Z01419 Encounter for gynecological examination (general) (routine) without abnormal findings: Secondary | ICD-10-CM | POA: Diagnosis not present

## 2017-09-16 DIAGNOSIS — L989 Disorder of the skin and subcutaneous tissue, unspecified: Secondary | ICD-10-CM

## 2017-09-16 MED ORDER — ZOLPIDEM TARTRATE 5 MG PO TABS: 5.0000 mg | ORAL_TABLET | Freq: Every evening | ORAL | 2 refills | Status: DC | PRN

## 2017-09-16 MED ORDER — CLOBETASOL PROPIONATE 0.05 % EX CREA: 1.0000 "application " | TOPICAL_CREAM | Freq: Two times a day (BID) | CUTANEOUS | 1 refills | Status: DC

## 2017-09-16 NOTE — Progress Notes (Signed)
Subjective:        Diana Garcia is a 76 y.o. female here for a routine exam.  Current complaints: Difficulty getting to sleep.    Personal health questionnaire:  Is patient Ashkenazi Jewish, have a family history of breast and/or ovarian cancer: no Is there a family history of uterine cancer diagnosed at age < 350, gastrointestinal cancer, urinary tract cancer, family member who is a Personnel officerLynch syndrome-associated carrier: no Is the patient overweight and hypertensive, family history of diabetes, personal history of gestational diabetes, preeclampsia or PCOS: no Is patient over 4255, have PCOS,  family history of premature CHD under age 76, diabetes, smoke, have hypertension or peripheral artery disease:  no At any time, has a partner hit, kicked or otherwise hurt or frightened you?: no Over the past 2 weeks, have you felt down, depressed or hopeless?: no Over the past 2 weeks, have you felt little interest or pleasure in doing things?:no   Gynecologic History No LMP recorded. Patient is postmenopausal. Contraception: post menopausal status Last Pap: 2016. Results were: normal Last mammogram: 2018. Results were: normal  Obstetric History OB History  No data available    Past Medical History:  Diagnosis Date  . Closed fracture of fifth metatarsal bone   . Diabetes mellitus without complication (HCC)   . Hyperlipidemia   . Hypertension   . Insomnia   . Insomnia     Past Surgical History:  Procedure Laterality Date  . CATARACT EXTRACTION, BILATERAL Bilateral 01/2017  . LOWER EXTREMITY ANGIOGRAM N/A 05/16/2012   Procedure: LOWER EXTREMITY ANGIOGRAM;  Surgeon: Pamella PertJagadeesh R Ganji, MD;  Location: [facility] CATH LAB;  Service: Cardiovascular;  Laterality: N/A;     Current Outpatient Prescriptions:  .  amLODipine (NORVASC) 10 MG tablet, Take 1 tablet by mouth  daily, Disp: 90 tablet, Rfl: 3 .  aspirin EC 81 MG tablet, Take 81 mg by mouth daily., Disp: , Rfl:  .  Calcium Carbonate-Vitamin D  (CALCIUM 600 + D PO), Take 1 tablet by mouth 3 (three) times a week. No specific days, Disp: , Rfl:  .  carvedilol (COREG) 3.125 MG tablet, Take 1 tablet (3.125 mg total) by mouth 2 (two) times daily with a meal., Disp: 180 tablet, Rfl: 3 .  Cholecalciferol (VITAMIN D PO), Take 4,000 Units by mouth 3 (three) times a week. No specific days, Disp: , Rfl:  .  clobetasol cream (TEMOVATE) 0.05 %, Apply 1 application topically 2 (two) times daily., Disp: 60 g, Rfl: 1 .  clopidogrel (PLAVIX) 75 MG tablet, Take 1 tablet by mouth  daily, Disp: 90 tablet, Rfl: 3 .  losartan (COZAAR) 25 MG tablet, Take 1 tablet (25 mg total) by mouth daily., Disp: 90 tablet, Rfl: 3 .  metFORMIN (GLUCOPHAGE) 500 MG tablet, Take 1 tablet by mouth  twice a day with meals, Disp: 180 tablet, Rfl: 3 .  mometasone (ELOCON) 0.1 % cream, Apply 1 application topically daily., Disp: 50 g, Rfl: 2 .  niacin 500 MG tablet, Take 500 mg by mouth at bedtime., Disp: , Rfl:  .  rosuvastatin (CRESTOR) 20 MG tablet, Take 20 mg by mouth daily., Disp: , Rfl:  .  zolpidem (AMBIEN) 5 MG tablet, Take 1 tablet (5 mg total) by mouth at bedtime as needed for sleep., Disp: 15 tablet, Rfl: 2 Allergies  Allergen Reactions  . Lisinopril     fainting  . Penicillins Hives  . Pletal [Cilostazol] Palpitations    Social History  Substance Use Topics  .  Smoking status: Never Smoker  . Smokeless tobacco: Never Used  . Alcohol use 0.0 oz/week     Comment: Rarely    Family History  Problem Relation Age of Onset  . Hyperlipidemia Mother   . Hypertension Mother   . Diabetes Father   . Heart attack Father   . Diabetes Sister   . Hypertension Sister   . Breast cancer Sister   . Diabetes Brother   . Sudden death Neg Hx       Review of Systems  Constitutional: negative for fatigue and weight loss Respiratory: negative for cough and wheezing Cardiovascular: negative for chest pain, fatigue and palpitations Gastrointestinal: negative for abdominal  pain and change in bowel habits Musculoskeletal:negative for myalgias Neurological: negative for gait problems and tremors Behavioral/Psych: negative for abusive relationship, depression Endocrine: negative for temperature intolerance    Genitourinary:negative for abnormal menstrual periods, genital lesions, hot flashes, sexual problems and vaginal discharge Integument/breast: negative for breast lump, breast tenderness, nipple discharge and skin lesion(s)    Objective:       There were no vitals taken for this visit. General:   alert  Skin:   no rash or abnormalities  Lungs:   clear to auscultation bilaterally  Heart:   regular rate and rhythm, S1, S2 normal, no murmur, click, rub or gallop  Breasts:   normal without suspicious masses, skin or nipple changes or axillary nodes  Abdomen:  normal findings: no organomegaly, soft, non-tender and no hernia  Pelvis:  External genitalia: normal general appearance Urinary system: urethral meatus normal and bladder without fullness, nontender Vaginal: normal without tenderness, induration or masses Cervix: normal appearance Adnexa: normal bimanual exam Uterus: anteverted and non-tender, normal size   Lab Review Urine pregnancy test Labs reviewed yes Radiologic studies reviewed yes  50% of 20 min visit spent on counseling and coordination of care.    Assessment:     1. Encounter for routine gynecological examination with Papanicolaou smear of cervix Rx: - Cytology - PAP  2. Dermatosis Rx: - clobetasol cream (TEMOVATE) 0.05 %; Apply 1 application topically 2 (two) times daily.  Dispense: 60 g; Refill: 1  3. Primary insomnia Rx: - zolpidem (AMBIEN) 5 MG tablet; Take 1 tablet (5 mg total) by mouth at bedtime as needed for sleep.  Dispense: 15 tablet; Refill: 2   Plan:    Education reviewed: calcium supplements, depression evaluation, low fat, low cholesterol diet, self breast exams and weight bearing exercise. Follow up in: 2  years.   Meds ordered this encounter  Medications  . clobetasol cream (TEMOVATE) 0.05 %    Sig: Apply 1 application topically 2 (two) times daily.    Dispense:  60 g    Refill:  1  . zolpidem (AMBIEN) 5 MG tablet    Sig: Take 1 tablet (5 mg total) by mouth at bedtime as needed for sleep.    Dispense:  15 tablet    Refill:  2   No orders of the defined types were placed in this encounter.

## 2017-09-17 LAB — CYTOLOGY - PAP
Diagnosis: NEGATIVE
HPV: NOT DETECTED

## 2017-12-14 DIAGNOSIS — I6523 Occlusion and stenosis of bilateral carotid arteries: Secondary | ICD-10-CM | POA: Diagnosis not present

## 2017-12-22 DIAGNOSIS — I70212 Atherosclerosis of native arteries of extremities with intermittent claudication, left leg: Secondary | ICD-10-CM | POA: Diagnosis not present

## 2017-12-22 DIAGNOSIS — E1151 Type 2 diabetes mellitus with diabetic peripheral angiopathy without gangrene: Secondary | ICD-10-CM | POA: Diagnosis not present

## 2017-12-22 DIAGNOSIS — I6523 Occlusion and stenosis of bilateral carotid arteries: Secondary | ICD-10-CM | POA: Diagnosis not present

## 2017-12-22 DIAGNOSIS — I10 Essential (primary) hypertension: Secondary | ICD-10-CM | POA: Diagnosis not present

## 2018-03-07 DIAGNOSIS — E785 Hyperlipidemia, unspecified: Secondary | ICD-10-CM | POA: Diagnosis not present

## 2018-03-07 DIAGNOSIS — I739 Peripheral vascular disease, unspecified: Secondary | ICD-10-CM | POA: Diagnosis not present

## 2018-03-07 DIAGNOSIS — Z7984 Long term (current) use of oral hypoglycemic drugs: Secondary | ICD-10-CM | POA: Diagnosis not present

## 2018-03-07 DIAGNOSIS — D649 Anemia, unspecified: Secondary | ICD-10-CM | POA: Diagnosis not present

## 2018-03-07 DIAGNOSIS — Z7902 Long term (current) use of antithrombotics/antiplatelets: Secondary | ICD-10-CM | POA: Diagnosis not present

## 2018-03-07 DIAGNOSIS — I10 Essential (primary) hypertension: Secondary | ICD-10-CM | POA: Diagnosis not present

## 2018-03-07 DIAGNOSIS — E119 Type 2 diabetes mellitus without complications: Secondary | ICD-10-CM | POA: Diagnosis not present

## 2018-03-16 ENCOUNTER — Other Ambulatory Visit: Payer: Self-pay | Admitting: Family Medicine

## 2018-03-16 DIAGNOSIS — Z1231 Encounter for screening mammogram for malignant neoplasm of breast: Secondary | ICD-10-CM

## 2018-04-11 ENCOUNTER — Ambulatory Visit
Admission: RE | Admit: 2018-04-11 | Discharge: 2018-04-11 | Disposition: A | Discharge status: Home / Self Care | Payer: Medicare Other | Source: Ambulatory Visit | Attending: Family Medicine | Admitting: Family Medicine

## 2018-04-11 DIAGNOSIS — Z1231 Encounter for screening mammogram for malignant neoplasm of breast: Secondary | ICD-10-CM

## 2018-07-26 DIAGNOSIS — I6523 Occlusion and stenosis of bilateral carotid arteries: Secondary | ICD-10-CM | POA: Diagnosis not present

## 2018-08-26 DIAGNOSIS — H35033 Hypertensive retinopathy, bilateral: Secondary | ICD-10-CM | POA: Diagnosis not present

## 2018-08-26 DIAGNOSIS — Z961 Presence of intraocular lens: Secondary | ICD-10-CM | POA: Diagnosis not present

## 2018-08-26 DIAGNOSIS — H26493 Other secondary cataract, bilateral: Secondary | ICD-10-CM | POA: Diagnosis not present

## 2018-08-26 DIAGNOSIS — E119 Type 2 diabetes mellitus without complications: Secondary | ICD-10-CM | POA: Diagnosis not present

## 2018-09-06 DIAGNOSIS — E119 Type 2 diabetes mellitus without complications: Secondary | ICD-10-CM | POA: Diagnosis not present

## 2018-09-06 DIAGNOSIS — Z23 Encounter for immunization: Secondary | ICD-10-CM | POA: Diagnosis not present

## 2018-09-06 DIAGNOSIS — I10 Essential (primary) hypertension: Secondary | ICD-10-CM | POA: Diagnosis not present

## 2018-09-06 DIAGNOSIS — I739 Peripheral vascular disease, unspecified: Secondary | ICD-10-CM | POA: Diagnosis not present

## 2018-09-06 DIAGNOSIS — K635 Polyp of colon: Secondary | ICD-10-CM | POA: Diagnosis not present

## 2018-09-06 DIAGNOSIS — R1314 Dysphagia, pharyngoesophageal phase: Secondary | ICD-10-CM | POA: Diagnosis not present

## 2018-09-06 DIAGNOSIS — Z7902 Long term (current) use of antithrombotics/antiplatelets: Secondary | ICD-10-CM | POA: Diagnosis not present

## 2018-09-06 DIAGNOSIS — E785 Hyperlipidemia, unspecified: Secondary | ICD-10-CM | POA: Diagnosis not present

## 2018-09-28 DIAGNOSIS — R1310 Dysphagia, unspecified: Secondary | ICD-10-CM | POA: Diagnosis not present

## 2018-11-10 DIAGNOSIS — R1310 Dysphagia, unspecified: Secondary | ICD-10-CM | POA: Diagnosis not present

## 2018-11-10 DIAGNOSIS — K2930 Chronic superficial gastritis without bleeding: Secondary | ICD-10-CM | POA: Diagnosis not present

## 2018-11-10 DIAGNOSIS — K449 Diaphragmatic hernia without obstruction or gangrene: Secondary | ICD-10-CM | POA: Diagnosis not present

## 2018-11-10 DIAGNOSIS — K259 Gastric ulcer, unspecified as acute or chronic, without hemorrhage or perforation: Secondary | ICD-10-CM | POA: Diagnosis not present

## 2018-11-10 DIAGNOSIS — K2900 Acute gastritis without bleeding: Secondary | ICD-10-CM | POA: Diagnosis not present

## 2018-11-10 DIAGNOSIS — K2940 Chronic atrophic gastritis without bleeding: Secondary | ICD-10-CM | POA: Diagnosis not present

## 2018-11-10 DIAGNOSIS — K3189 Other diseases of stomach and duodenum: Secondary | ICD-10-CM | POA: Diagnosis not present

## 2018-11-16 DIAGNOSIS — K2940 Chronic atrophic gastritis without bleeding: Secondary | ICD-10-CM | POA: Diagnosis not present

## 2018-11-16 DIAGNOSIS — K2930 Chronic superficial gastritis without bleeding: Secondary | ICD-10-CM | POA: Diagnosis not present

## 2018-12-26 DIAGNOSIS — I6523 Occlusion and stenosis of bilateral carotid arteries: Secondary | ICD-10-CM | POA: Diagnosis not present

## 2018-12-26 DIAGNOSIS — E1151 Type 2 diabetes mellitus with diabetic peripheral angiopathy without gangrene: Secondary | ICD-10-CM | POA: Diagnosis not present

## 2018-12-26 DIAGNOSIS — I10 Essential (primary) hypertension: Secondary | ICD-10-CM | POA: Diagnosis not present

## 2018-12-26 DIAGNOSIS — I70212 Atherosclerosis of native arteries of extremities with intermittent claudication, left leg: Secondary | ICD-10-CM | POA: Diagnosis not present

## 2019-01-11 ENCOUNTER — Other Ambulatory Visit: Payer: Self-pay | Admitting: Cardiology

## 2019-01-11 DIAGNOSIS — I6523 Occlusion and stenosis of bilateral carotid arteries: Secondary | ICD-10-CM

## 2019-01-31 ENCOUNTER — Other Ambulatory Visit: Payer: Self-pay

## 2019-02-08 ENCOUNTER — Other Ambulatory Visit: Payer: Self-pay

## 2019-02-08 ENCOUNTER — Ambulatory Visit: Payer: Medicare Other

## 2019-02-08 DIAGNOSIS — I6523 Occlusion and stenosis of bilateral carotid arteries: Secondary | ICD-10-CM

## 2019-02-12 ENCOUNTER — Other Ambulatory Visit: Payer: Self-pay | Admitting: Cardiology

## 2019-02-12 DIAGNOSIS — I6523 Occlusion and stenosis of bilateral carotid arteries: Secondary | ICD-10-CM

## 2019-02-15 NOTE — Progress Notes (Signed)
Postpone to after her carotid duplex and ask her to bring any labs from PCP on her visit unless she wants to see me sooner

## 2019-03-09 DIAGNOSIS — I739 Peripheral vascular disease, unspecified: Secondary | ICD-10-CM | POA: Diagnosis not present

## 2019-03-09 DIAGNOSIS — R1314 Dysphagia, pharyngoesophageal phase: Secondary | ICD-10-CM | POA: Diagnosis not present

## 2019-03-09 DIAGNOSIS — Z23 Encounter for immunization: Secondary | ICD-10-CM | POA: Diagnosis not present

## 2019-03-09 DIAGNOSIS — K635 Polyp of colon: Secondary | ICD-10-CM | POA: Diagnosis not present

## 2019-03-09 DIAGNOSIS — K222 Esophageal obstruction: Secondary | ICD-10-CM | POA: Diagnosis not present

## 2019-03-09 DIAGNOSIS — E785 Hyperlipidemia, unspecified: Secondary | ICD-10-CM | POA: Diagnosis not present

## 2019-03-09 DIAGNOSIS — E119 Type 2 diabetes mellitus without complications: Secondary | ICD-10-CM | POA: Diagnosis not present

## 2019-03-09 DIAGNOSIS — K259 Gastric ulcer, unspecified as acute or chronic, without hemorrhage or perforation: Secondary | ICD-10-CM | POA: Diagnosis not present

## 2019-03-09 DIAGNOSIS — Z0000 Encounter for general adult medical examination without abnormal findings: Secondary | ICD-10-CM | POA: Diagnosis not present

## 2019-03-09 DIAGNOSIS — I10 Essential (primary) hypertension: Secondary | ICD-10-CM | POA: Diagnosis not present

## 2019-03-09 DIAGNOSIS — Z7902 Long term (current) use of antithrombotics/antiplatelets: Secondary | ICD-10-CM | POA: Diagnosis not present

## 2019-03-21 ENCOUNTER — Other Ambulatory Visit: Payer: Self-pay | Admitting: Family Medicine

## 2019-03-21 DIAGNOSIS — Z1231 Encounter for screening mammogram for malignant neoplasm of breast: Secondary | ICD-10-CM

## 2019-05-18 ENCOUNTER — Ambulatory Visit
Admission: RE | Admit: 2019-05-18 | Discharge: 2019-05-18 | Disposition: A | Discharge status: Home / Self Care | Payer: Medicare Other | Source: Ambulatory Visit | Attending: Family Medicine | Admitting: Family Medicine

## 2019-05-18 ENCOUNTER — Other Ambulatory Visit: Payer: Self-pay

## 2019-05-18 DIAGNOSIS — Z1231 Encounter for screening mammogram for malignant neoplasm of breast: Secondary | ICD-10-CM

## 2019-07-06 ENCOUNTER — Ambulatory Visit: Payer: Medicare Other | Admitting: Cardiology

## 2019-08-03 ENCOUNTER — Ambulatory Visit (INDEPENDENT_AMBULATORY_CARE_PROVIDER_SITE_OTHER): Payer: Medicare Other

## 2019-08-03 ENCOUNTER — Other Ambulatory Visit: Payer: Self-pay

## 2019-08-03 DIAGNOSIS — I6523 Occlusion and stenosis of bilateral carotid arteries: Secondary | ICD-10-CM | POA: Diagnosis not present

## 2019-08-08 ENCOUNTER — Other Ambulatory Visit: Payer: Self-pay | Admitting: Cardiology

## 2019-08-08 DIAGNOSIS — I6523 Occlusion and stenosis of bilateral carotid arteries: Secondary | ICD-10-CM

## 2019-08-24 ENCOUNTER — Ambulatory Visit: Payer: Medicare Other | Admitting: Cardiology

## 2019-08-31 DIAGNOSIS — Z961 Presence of intraocular lens: Secondary | ICD-10-CM | POA: Diagnosis not present

## 2019-08-31 DIAGNOSIS — H35033 Hypertensive retinopathy, bilateral: Secondary | ICD-10-CM | POA: Diagnosis not present

## 2019-08-31 DIAGNOSIS — H26493 Other secondary cataract, bilateral: Secondary | ICD-10-CM | POA: Diagnosis not present

## 2019-08-31 DIAGNOSIS — E119 Type 2 diabetes mellitus without complications: Secondary | ICD-10-CM | POA: Diagnosis not present

## 2019-08-31 DIAGNOSIS — H26491 Other secondary cataract, right eye: Secondary | ICD-10-CM | POA: Diagnosis not present

## 2019-09-11 ENCOUNTER — Ambulatory Visit (INDEPENDENT_AMBULATORY_CARE_PROVIDER_SITE_OTHER): Payer: Medicare Other | Admitting: Cardiology

## 2019-09-11 ENCOUNTER — Other Ambulatory Visit: Payer: Self-pay

## 2019-09-11 ENCOUNTER — Encounter: Payer: Self-pay | Admitting: Cardiology

## 2019-09-11 VITALS — BP 120/72 | Ht 62.0 in | Wt 134.7 lb

## 2019-09-11 DIAGNOSIS — I6523 Occlusion and stenosis of bilateral carotid arteries: Secondary | ICD-10-CM | POA: Diagnosis not present

## 2019-09-11 DIAGNOSIS — I739 Peripheral vascular disease, unspecified: Secondary | ICD-10-CM | POA: Diagnosis not present

## 2019-09-11 DIAGNOSIS — I10 Essential (primary) hypertension: Secondary | ICD-10-CM

## 2019-09-11 DIAGNOSIS — E785 Hyperlipidemia, unspecified: Secondary | ICD-10-CM | POA: Diagnosis not present

## 2019-09-11 NOTE — Progress Notes (Signed)
Primary Physician/Referring:  Ileana Ladd, MD  Patient ID: Diana Garcia, female    DOB: 1941/08/31, 78 y.o.   MRN: [id number]  Chief Complaint  Patient presents with  . Hypertension  . Hyperlipidemia  . Carotid results  . Follow-up    6 mth    HPI:    Diana Garcia  is a 78 y.o. African-American female with history of known peripheral arterial disease and left SFA angioplasty in 2013, asymptomatic carotid artery stenosis, hypertension, DM and hyperlipidmia who presents here for annual follow up.   She denies any symptoms of claudication since then. She denies any chest pain, shortness of breath, PND or orthopnea. No symptoms to suggest TIA or amarousis.  No complaints today, has occasional cramping of legs at night.  Past Medical History:  Diagnosis Date  . Closed fracture of fifth metatarsal bone   . Diabetes mellitus without complication (HCC)   . Hyperlipidemia   . Hypertension   . Insomnia   . Insomnia    Past Surgical History:  Procedure Laterality Date  . CATARACT EXTRACTION, BILATERAL Bilateral 01/2017  . LOWER EXTREMITY ANGIOGRAM N/A 05/16/2012   Procedure: LOWER EXTREMITY ANGIOGRAM;  Surgeon: Pamella Pert, MD;  Location: [facility] CATH LAB;  Service: Cardiovascular;  Laterality: N/A;   Social History   Socioeconomic History  . Marital status: Married    Spouse name: Not on file  . Number of children: 3  . Years of education: Not on file  . Highest education level: Not on file  Occupational History  . Not on file  Social Needs  . Financial resource strain: Not on file  . Food insecurity    Worry: Not on file    Inability: Not on file  . Transportation needs    Medical: Not on file    Non-medical: Not on file  Tobacco Use  . Smoking status: Never Smoker  . Smokeless tobacco: Never Used  Substance and Sexual Activity  . Alcohol use: Yes    Alcohol/week: 0.0 standard drinks    Comment: Rarely  . Drug use: No  . Sexual activity: Not Currently   Lifestyle  . Physical activity    Days per week: Not on file    Minutes per session: Not on file  . Stress: Not on file  Relationships  . Social Musician on phone: Not on file    Gets together: Not on file    Attends religious service: Not on file    Active member of club or organization: Not on file    Attends meetings of clubs or organizations: Not on file    Relationship status: Not on file  . Intimate partner violence    Fear of current or ex partner: Not on file    Emotionally abused: Not on file    Physically abused: Not on file    Forced sexual activity: Not on file  Other Topics Concern  . Not on file  Social History Narrative  . Not on file   ROS  Review of Systems  Constitution: Negative for chills, decreased appetite, malaise/fatigue and weight gain.  Cardiovascular: Negative for dyspnea on exertion, leg swelling and syncope.  Endocrine: Negative for cold intolerance.  Hematologic/Lymphatic: Does not bruise/bleed easily.  Musculoskeletal: Positive for muscle cramps (night cramps). Negative for joint swelling.  Gastrointestinal: Negative for abdominal pain, anorexia, change in bowel habit, hematochezia and melena.  Neurological: Negative for headaches and light-headedness.  Psychiatric/Behavioral: Negative for depression and  substance abuse.  All other systems reviewed and are negative.  Objective   Vitals with BMI 09/11/2019 09/10/2015 02/28/2015  Height 5\' 2"  5\' 3"  5\' 3"   Weight 134 lbs 11 oz 139 lbs 141 lbs  BMI 24.63 24.7 25  Systolic - 106 113  Diastolic - 67 68  Pulse - 68 65    Height 5\' 2"  (1.575 m), weight 134 lb 11.2 oz (61.1 kg). Body mass index is 24.64 kg/m.   Physical Exam  Constitutional: No distress.  Moderately built and well-nourished, appears younger than stated age.  No acute distress.  HENT:  Head: Atraumatic.  Eyes: Conjunctivae are normal.  Neck: Neck supple. No JVD present. No thyromegaly present.  Cardiovascular:  Normal rate, regular rhythm, S1 normal, S2 normal and intact distal pulses. Exam reveals no gallop.  Murmur heard.  Early systolic murmur is present with a grade of 2/6 at the upper right sternal border. Pulses:      Carotid pulses are 2+ on the right side with bruit and 2+ on the left side with bruit.      Popliteal pulses are 2+ on the right side and 2+ on the left side.       Dorsalis pedis pulses are 2+ on the right side and 2+ on the left side.       Posterior tibial pulses are 0 on the right side and 0 on the left side.  Pulmonary/Chest: Effort normal and breath sounds normal.  Abdominal: Soft. Bowel sounds are normal.  Musculoskeletal: Normal range of motion.        General: No edema.  Neurological: She is alert.  Skin: Skin is warm and dry.  Psychiatric: She has a normal mood and affect.   Radiology: No results found.  Laboratory examination:   No results for input(s): NA, K, CL, CO2, GLUCOSE, BUN, CREATININE, CALCIUM, GFRNONAA, GFRAA in the last 8760 hours. CMP Latest Ref Rng & Units 03/20/2014 10/23/2013 06/15/2013  Glucose 65 - 99 mg/dL 77 80 88  BUN 8 - 27 mg/dL 14 11 12   Creatinine 0.57 - 1.00 mg/dL 7.820.66 9.560.61 2.130.65  Sodium 134 - 144 mmol/L 140 141 143  Potassium 3.5 - 5.2 mmol/L 4.0 4.5 4.5  Chloride 97 - 108 mmol/L 100 103 108  CO2 18 - 29 mmol/L 23 26 28   Calcium 8.7 - 10.3 mg/dL 9.9 9.5 9.9  Total Protein 6.0 - 8.5 g/dL 6.7 6.4 7.0  Total Bilirubin 0.0 - 1.2 mg/dL 0.3 0.2 0.3  Alkaline Phos 39 - 117 IU/L 62 60 61  AST 0 - 40 IU/L 18 16 14   ALT 0 - 32 IU/L 10 11 10    CBC Latest Ref Rng & Units 05/17/2012 05/16/2012  WBC 4.0 - 10.5 K/uL 7.8 -  Hemoglobin 12.0 - 15.0 g/dL 11.4(L) 12.9  Hematocrit 36.0 - 46.0 % 36.3 38.0  Platelets 150 - 400 K/uL 164 -   Lipid Panel     Component Value Date/Time   CHOL 146 03/20/2014 1558   CHOL 134 06/15/2013 1441   TRIG 74 03/20/2014 1558   TRIG 103 10/23/2013 1204   TRIG 74 06/15/2013 1441   HDL 52 03/20/2014 1558   HDL  45 10/23/2013 1204   HDL 43 06/15/2013 1441   CHOLHDL 2.8 03/20/2014 1558   LDLCALC 79 03/20/2014 1558   LDLCALC 70 10/23/2013 1204   LDLCALC 76 06/15/2013 1441   HEMOGLOBIN A1C Lab Results  Component Value Date   HGBA1C 5.8 03/20/2014   TSH  No results for input(s): TSH in the last 8760 hours. Medications and allergies   Allergies  Allergen Reactions  . Lisinopril     fainting  . Penicillins Hives  . Pletal [Cilostazol] Palpitations     Prior to Admission medications   Medication Sig Start Date End Date Taking? Authorizing Provider  amLODipine (NORVASC) 10 MG tablet Take 1 tablet by mouth  daily 03/20/14  Yes Vernie Shanks, MD  aspirin EC 81 MG tablet Take 81 mg by mouth daily.   Yes [provider]  Calcium Carbonate-Vitamin D (CALCIUM 600 + D PO) Take 1 tablet by mouth 3 (three) times a week. No specific days   Yes [provider]  carvedilol (COREG) 3.125 MG tablet Take 1 tablet (3.125 mg total) by mouth 2 (two) times daily with a meal. 03/20/14  Yes Vernie Shanks, MD  Cholecalciferol (VITAMIN D PO) Take 4,000 Units by mouth 3 (three) times a week. No specific days   Yes [provider]  clopidogrel (PLAVIX) 75 MG tablet Take 1 tablet by mouth  daily 03/20/14  Yes Vernie Shanks, MD  losartan (COZAAR) 25 MG tablet Take 1 tablet (25 mg total) by mouth daily. 03/20/14  Yes Vernie Shanks, MD  metFORMIN (GLUCOPHAGE) 500 MG tablet Take 1 tablet by mouth  twice a day with meals 03/20/14  Yes Vernie Shanks, MD  niacin 500 MG tablet Take 500 mg by mouth at bedtime.   Yes [provider]  rosuvastatin (CRESTOR) 20 MG tablet Take 20 mg by mouth daily.   Yes [provider]     Current Outpatient Medications  Medication Instructions  . amLODipine (NORVASC) 10 MG tablet Take 1 tablet by mouth  daily  . aspirin EC 81 mg, Daily  . Calcium Carbonate-Vitamin D (CALCIUM 600 + D PO) 1 tablet, 3 times weekly  . carvedilol (COREG) 3.125 mg,  Oral, 2 times daily with meals  . Cholecalciferol (VITAMIN D PO) 4,000 Units, 3 times weekly  . clopidogrel (PLAVIX) 75 MG tablet Take 1 tablet by mouth  daily  . losartan (COZAAR) 25 mg, Oral, Daily  . metFORMIN (GLUCOPHAGE) 500 MG tablet Take 1 tablet by mouth  twice a day with meals  . niacin 500 mg, Daily at bedtime  . rosuvastatin (CRESTOR) 20 mg, Oral, Daily    Cardiac Studies:   Peripheral arteriogram 05/16/12:  Left mid SFA 99% to 0%. 6x22mm Angiosculpt balloon PTA. Left AT occluded.  Lower extremity arterial duplex 12/12/2013: No hemodynamically significant stenosis is identified on either side. This exam reveals mildly decreased perfusion of the right lower extremity, noted at the post tibial artery level.This exam reveals mildly decreased perfusion of the left lower extremity, noted at the post tibial artery level. Right ABI 0.91 and left ABI 0.91. Compared to the study done on 06/26/13, RABI 0.88 and LABI 0.92., no significant change. Study suggests patent left PTA site. Recheck in 1 year if clinically indicated.  Carotid artery duplex  08/03/2019: Stenosis in the right internal carotid artery (50-69%). Stenosis in the left internal carotid artery (50-69%). Tortuous bilateral ICA.  Antegrade right vertebral artery flow. Antegrade left vertebral artery flow. No significant change from 02/08/2019. Follow up in six months is appropriate if clinically indicated.  Assessment     ICD-10-CM   1. Essential hypertension  I10 EKG 12-Lead  2. Hyperlipidemia, unspecified hyperlipidemia type  E78.5 EKG 12-Lead  3. Asymptomatic bilateral carotid artery stenosis  I65.23  EKG 09/11/2019: Sinus bradycardia at rate of 55 bpm, borderline criteria for left atrial enlargement, otherwise normal EKG. No significant change from    EKG 12/26/2018:    Recommendations:   Ms. Koors is here on annual visit, she has had remote angioplasty to her lower extremity with complete resolution of symptoms  of claudication except for very rare occasional night cramps.  No change in physical exam since her exam a year ago, she is very soft bilateral carotid bruit, absent PT pulses but otherwise normal exam.  Blood pressure is also well controlled, she has not had any chest pain, shortness of breath, no clinical evidence of heart failure.  She is undressed medical therapy, she is also on high dose high intensity statins, lipids being managed by her PCP. Goal LDL of less than 70 mg.  Her carotid artery duplex has remained stable with around 50-69% bilateral ICA stenosis.  We will continue to do surveillance Dopplers on a six-month basis.  Unless any new symptoms, I'll see her back in one year.  Yates Decamp, MD, [facility] 09/11/2019, 10:34 AM [facility] [place] Pager: [phone] Office: [phone] If no answer Cell [phone]

## 2019-09-12 DIAGNOSIS — Z23 Encounter for immunization: Secondary | ICD-10-CM | POA: Diagnosis not present

## 2019-09-12 DIAGNOSIS — I10 Essential (primary) hypertension: Secondary | ICD-10-CM | POA: Diagnosis not present

## 2019-09-12 DIAGNOSIS — I739 Peripheral vascular disease, unspecified: Secondary | ICD-10-CM | POA: Diagnosis not present

## 2019-09-12 DIAGNOSIS — Z7902 Long term (current) use of antithrombotics/antiplatelets: Secondary | ICD-10-CM | POA: Diagnosis not present

## 2019-09-12 DIAGNOSIS — E785 Hyperlipidemia, unspecified: Secondary | ICD-10-CM | POA: Diagnosis not present

## 2019-09-12 DIAGNOSIS — K222 Esophageal obstruction: Secondary | ICD-10-CM | POA: Diagnosis not present

## 2019-09-12 DIAGNOSIS — E118 Type 2 diabetes mellitus with unspecified complications: Secondary | ICD-10-CM | POA: Diagnosis not present

## 2019-11-29 DIAGNOSIS — B029 Zoster without complications: Secondary | ICD-10-CM | POA: Diagnosis not present

## 2020-03-14 DIAGNOSIS — Z0000 Encounter for general adult medical examination without abnormal findings: Secondary | ICD-10-CM | POA: Diagnosis not present

## 2020-03-14 DIAGNOSIS — E118 Type 2 diabetes mellitus with unspecified complications: Secondary | ICD-10-CM | POA: Diagnosis not present

## 2020-03-14 DIAGNOSIS — F5101 Primary insomnia: Secondary | ICD-10-CM | POA: Diagnosis not present

## 2020-03-14 DIAGNOSIS — E785 Hyperlipidemia, unspecified: Secondary | ICD-10-CM | POA: Diagnosis not present

## 2020-03-14 DIAGNOSIS — I10 Essential (primary) hypertension: Secondary | ICD-10-CM | POA: Diagnosis not present

## 2020-03-14 DIAGNOSIS — Z7902 Long term (current) use of antithrombotics/antiplatelets: Secondary | ICD-10-CM | POA: Diagnosis not present

## 2020-03-14 DIAGNOSIS — Z23 Encounter for immunization: Secondary | ICD-10-CM | POA: Diagnosis not present

## 2020-03-14 DIAGNOSIS — S90112A Contusion of left great toe without damage to nail, initial encounter: Secondary | ICD-10-CM | POA: Diagnosis not present

## 2020-03-14 DIAGNOSIS — Z86010 Personal history of colonic polyps: Secondary | ICD-10-CM | POA: Diagnosis not present

## 2020-03-14 DIAGNOSIS — D649 Anemia, unspecified: Secondary | ICD-10-CM | POA: Diagnosis not present

## 2020-03-26 ENCOUNTER — Other Ambulatory Visit: Payer: Self-pay | Admitting: Family Medicine

## 2020-03-26 DIAGNOSIS — Z1231 Encounter for screening mammogram for malignant neoplasm of breast: Secondary | ICD-10-CM

## 2020-04-16 DIAGNOSIS — E785 Hyperlipidemia, unspecified: Secondary | ICD-10-CM | POA: Diagnosis not present

## 2020-04-16 DIAGNOSIS — Z7902 Long term (current) use of antithrombotics/antiplatelets: Secondary | ICD-10-CM | POA: Diagnosis not present

## 2020-04-16 DIAGNOSIS — Z23 Encounter for immunization: Secondary | ICD-10-CM | POA: Diagnosis not present

## 2020-04-16 DIAGNOSIS — E118 Type 2 diabetes mellitus with unspecified complications: Secondary | ICD-10-CM | POA: Diagnosis not present

## 2020-04-16 DIAGNOSIS — Z0000 Encounter for general adult medical examination without abnormal findings: Secondary | ICD-10-CM | POA: Diagnosis not present

## 2020-04-16 DIAGNOSIS — D649 Anemia, unspecified: Secondary | ICD-10-CM | POA: Diagnosis not present

## 2020-04-16 DIAGNOSIS — Z86010 Personal history of colonic polyps: Secondary | ICD-10-CM | POA: Diagnosis not present

## 2020-04-16 DIAGNOSIS — I10 Essential (primary) hypertension: Secondary | ICD-10-CM | POA: Diagnosis not present

## 2020-04-16 DIAGNOSIS — F5101 Primary insomnia: Secondary | ICD-10-CM | POA: Diagnosis not present

## 2020-05-06 DIAGNOSIS — I10 Essential (primary) hypertension: Secondary | ICD-10-CM | POA: Diagnosis not present

## 2020-05-06 DIAGNOSIS — E118 Type 2 diabetes mellitus with unspecified complications: Secondary | ICD-10-CM | POA: Diagnosis not present

## 2020-05-06 DIAGNOSIS — D649 Anemia, unspecified: Secondary | ICD-10-CM | POA: Diagnosis not present

## 2020-05-06 DIAGNOSIS — E785 Hyperlipidemia, unspecified: Secondary | ICD-10-CM | POA: Diagnosis not present

## 2020-05-20 ENCOUNTER — Ambulatory Visit
Admission: RE | Admit: 2020-05-20 | Discharge: 2020-05-20 | Disposition: A | Discharge status: Home / Self Care | Payer: Medicare Other | Source: Ambulatory Visit | Attending: Family Medicine | Admitting: Family Medicine

## 2020-05-20 ENCOUNTER — Other Ambulatory Visit: Payer: Self-pay

## 2020-05-20 DIAGNOSIS — Z1231 Encounter for screening mammogram for malignant neoplasm of breast: Secondary | ICD-10-CM

## 2020-06-19 DIAGNOSIS — E785 Hyperlipidemia, unspecified: Secondary | ICD-10-CM | POA: Diagnosis not present

## 2020-06-19 DIAGNOSIS — D649 Anemia, unspecified: Secondary | ICD-10-CM | POA: Diagnosis not present

## 2020-06-19 DIAGNOSIS — I10 Essential (primary) hypertension: Secondary | ICD-10-CM | POA: Diagnosis not present

## 2020-06-19 DIAGNOSIS — E118 Type 2 diabetes mellitus with unspecified complications: Secondary | ICD-10-CM | POA: Diagnosis not present

## 2020-07-18 DIAGNOSIS — K279 Peptic ulcer, site unspecified, unspecified as acute or chronic, without hemorrhage or perforation: Secondary | ICD-10-CM | POA: Diagnosis not present

## 2020-07-18 DIAGNOSIS — D649 Anemia, unspecified: Secondary | ICD-10-CM | POA: Diagnosis not present

## 2020-07-18 DIAGNOSIS — K219 Gastro-esophageal reflux disease without esophagitis: Secondary | ICD-10-CM | POA: Diagnosis not present

## 2020-09-13 ENCOUNTER — Other Ambulatory Visit: Payer: Self-pay

## 2020-09-13 ENCOUNTER — Encounter: Payer: Self-pay | Admitting: Cardiology

## 2020-09-13 ENCOUNTER — Ambulatory Visit: Payer: Medicare Other | Admitting: Cardiology

## 2020-09-13 VITALS — BP 136/65 | HR 61 | Ht 62.0 in | Wt 133.0 lb

## 2020-09-13 DIAGNOSIS — I739 Peripheral vascular disease, unspecified: Secondary | ICD-10-CM

## 2020-09-13 DIAGNOSIS — I10 Essential (primary) hypertension: Secondary | ICD-10-CM | POA: Diagnosis not present

## 2020-09-13 DIAGNOSIS — E7800 Pure hypercholesterolemia, unspecified: Secondary | ICD-10-CM

## 2020-09-13 DIAGNOSIS — I6523 Occlusion and stenosis of bilateral carotid arteries: Secondary | ICD-10-CM | POA: Diagnosis not present

## 2020-09-13 NOTE — Progress Notes (Signed)
Primary Physician/Referring:  Ileana Ladd, MD  Patient ID: Diana Garcia, female    DOB: 09-09-1941, 79 y.o.   MRN: [id number]  Chief Complaint  Patient presents with  . PAD    1 year f/u   . Follow-up   HPI:    Diana Garcia  is a 79 y.o. African-American female with peripheral arterial disease and left SFA angioplasty in 2013, asymptomatic bilateral carotid artery stenosis, hypertension, DM and hyperlipidmia who presents here for annual follow up.   She denies any symptoms of claudication except occasional cramping especially at night.  he denies any chest pain, shortness of breath, PND or orthopnea. No symptoms to suggest TIA or amarousis.  Has occasional cramping of legs at night.  She does indeed complain of back spasm since she had a massage yesterday.  She had to take Aleve last night due to back pain and has noticed her blood pressure to be elevated this morning due to pain.  Past Medical History:  Diagnosis Date  . Closed fracture of fifth metatarsal bone   . Diabetes mellitus without complication (HCC)   . Hyperlipidemia   . Hypertension   . Insomnia   . Insomnia    Past Surgical History:  Procedure Laterality Date  . CATARACT EXTRACTION, BILATERAL Bilateral 01/2017  . LOWER EXTREMITY ANGIOGRAM N/A 05/16/2012   Procedure: LOWER EXTREMITY ANGIOGRAM;  Surgeon: Pamella Pert, MD;  Location: [facility] CATH LAB;  Service: Cardiovascular;  Laterality: N/A;   Social History   Tobacco Use  . Smoking status: Never Smoker  . Smokeless tobacco: Never Used  Substance Use Topics  . Alcohol use: Yes    Alcohol/week: 0.0 standard drinks    Comment: Rarely  Marital Status: Married    ROS  Review of Systems  Constitutional: Negative for chills, decreased appetite, malaise/fatigue and weight gain.  Cardiovascular: Negative for dyspnea on exertion, leg swelling and syncope.  Endocrine: Negative for cold intolerance.  Hematologic/Lymphatic: Does not bruise/bleed easily.    Musculoskeletal: Positive for back pain (since yesterday after a massage) and muscle cramps (night cramps). Negative for joint swelling.  Gastrointestinal: Negative for abdominal pain, anorexia, change in bowel habit, hematochezia and melena.  Neurological: Negative for headaches and light-headedness.  Psychiatric/Behavioral: Negative for depression and substance abuse.  All other systems reviewed and are negative.  Objective   Vitals with BMI 09/13/2020 09/13/2020 09/11/2019  Height - 5\' 2"  5\' 2"   Weight - 133 lbs 134 lbs 11 oz  BMI - 24.32 24.63  Systolic 136 157  Diastolic 65 67 72  Pulse 61 68 -    Blood pressure 136/65, pulse 61, height 5\' 2"  (1.575 m), weight 133 lb (60.3 kg), SpO2 100 %. Body mass index is 24.33 kg/m.   Physical Exam Constitutional:      General: She is not in acute distress.    Comments: Moderately built and well-nourished, appears younger than stated age.  No acute distress.  Eyes:     Conjunctiva/sclera: Conjunctivae normal.  Neck:     Thyroid: No thyromegaly.     Vascular: No JVD.  Cardiovascular:     Rate and Rhythm: Normal rate and regular rhythm.     Pulses: Intact distal pulses.          Carotid pulses are 2+ on the right side and 2+ on the left side with bruit.      Popliteal pulses are 2+ on the right side and 2+ on the left side.  Dorsalis pedis pulses are 2+ on the right side and 2+ on the left side.       Posterior tibial pulses are 0 on the right side and 0 on the left side.     Heart sounds: S1 normal and S2 normal. Murmur heard.  Early systolic murmur is present with a grade of 2/6 at the upper right sternal border.  No gallop.      Comments: Very soft bruit left carotid. No JVD. No Pedal edema Pulmonary:     Effort: Pulmonary effort is normal.     Breath sounds: Normal breath sounds.  Abdominal:     General: Bowel sounds are normal.     Palpations: Abdomen is soft.  Musculoskeletal:        General: Normal range of  motion.     Cervical back: Neck supple.  Skin:    General: Skin is warm and dry.    Radiology: No results found.  Laboratory examination:   External labs:  Cholesterol, total 136.000 m 03/14/2020 HDL 52.000 mg 03/14/2020 LDL-C 68.000 mg 03/14/2020 Triglycerides 85.000 mg 03/14/2020  A1C 6.100 % 03/14/2020  Hemoglobin 10.900 g/d 07/18/2020  Creatinine, Serum 0.770 mg/ 03/14/2020 Potassium 4.400 mm 03/14/2020  Medications and allergies   Allergies  Allergen Reactions  . Lisinopril     fainting  . Penicillins Hives  . Pletal [Cilostazol] Palpitations    Current Outpatient Medications on File Prior to Visit  Medication Sig Dispense Refill  . amLODipine (NORVASC) 10 MG tablet Take 1 tablet by mouth  daily 90 tablet 3  . aspirin EC 81 MG tablet Take 81 mg by mouth daily.    . Calcium Carbonate-Vitamin D (CALCIUM 600 + D PO) Take 1 tablet by mouth 3 (three) times a week. No specific days    . carvedilol (COREG) 3.125 MG tablet Take 1 tablet (3.125 mg total) by mouth 2 (two) times daily with a meal. 180 tablet 3  . Cholecalciferol (VITAMIN D PO) Take 4,000 Units by mouth 3 (three) times a week. No specific days    . losartan (COZAAR) 25 MG tablet Take 1 tablet (25 mg total) by mouth daily. 90 tablet 3  . metFORMIN (GLUCOPHAGE) 500 MG tablet Take 1 tablet by mouth  twice a day with meals 180 tablet 3  . niacin 500 MG tablet Take 500 mg by mouth at bedtime.    . rosuvastatin (CRESTOR) 20 MG tablet Take 20 mg by mouth daily.     No current facility-administered medications on file prior to visit.    Cardiac Studies:   Peripheral arteriogram 05/16/12:  Left mid SFA 99% to 0%. 6x1mm Angiosculpt balloon PTA. Left AT occluded.  Lower extremity arterial duplex 12/12/2013: No hemodynamically significant stenosis is identified on either side. This exam reveals mildly decreased perfusion of the right lower extremity, noted at the post tibial artery level.This exam reveals mildly decreased  perfusion of the left lower extremity, noted at the post tibial artery level. Right ABI 0.91 and left ABI 0.91. Compared to the study done on 06/26/13, RABI 0.88 and LABI 0.92., no significant change. Study suggests patent left PTA site. Recheck in 1 year if clinically indicated.  Carotid artery duplex  08/03/2019: Stenosis in the right internal carotid artery (50-69%). Stenosis in the left internal carotid artery (50-69%). Tortuous bilateral ICA.  Antegrade right vertebral artery flow. Antegrade left vertebral artery flow. No significant change from 02/08/2019. Follow up in six months is appropriate if clinically indicated.  EKG:  EKG 09/12/2020: Marked sinus bradycardia at rate of 58 bpm, left atrial enlargement, normal axis, poor R wave progression, low-voltage complexes.  Pulmonary disease pattern.  Nonspecific T abnormality.  No significant change from 09/03/2019.  Assessment     ICD-10-CM   1. Peripheral artery disease (HCC)  I73.9 EKG 12-Lead  2. Asymptomatic bilateral carotid artery stenosis  I65.23   3. Essential hypertension  I10   4. Pure hypercholesterolemia  E78.00   No orders of the defined types were placed in this encounter.  Medications Discontinued During This Encounter  Medication Reason  . clopidogrel (PLAVIX) 75 MG tablet Discontinued by provider      Recommendations:   Diana Garcia  is a 79 y.o. African-American female with peripheral arterial disease and left SFA angioplasty in 2013, asymptomatic bilateral carotid artery stenosis, hypertension, DM and hyperlipidmia who presents here for annual follow up.   She denies any symptoms of claudication except occasional cramping especially at night.  he denies any chest pain, shortness of breath, PND or orthopnea. No symptoms to suggest TIA or amarousis.  Has occasional cramping of legs at night.   Her blood pressure was slightly elevated today due to back strain that happened yesterday after a massage.  She has  been monitoring her blood pressure closely.  Hence I did not make any changes.  I reviewed her external labs, diabetes is under excellent control, lipids are also under excellent control.  Her physical examination is stable from previous exam, although carotid artery bruit is not as appreciated as it was previously.  Due to dysphagia, anemia, Plavix was discontinued since July of this year, hemoglobin is increased since then.  She was also found to have mild dysphagia and esophageal stricture that needed dilatation sometime in July 2020 by Charlott Rakes.  Her carotid artery duplex has remained stable with around 50-69% bilateral ICA stenosis.  We will continue to do surveillance Dopplers on a six-month basis.  Unless any new symptoms, I'll see her back in one year.   Yates Decamp, MD, [facility] 09/13/2020, 9:20 AM Office: [phone]

## 2020-09-17 DIAGNOSIS — D649 Anemia, unspecified: Secondary | ICD-10-CM | POA: Diagnosis not present

## 2020-09-17 DIAGNOSIS — Z23 Encounter for immunization: Secondary | ICD-10-CM | POA: Diagnosis not present

## 2020-09-17 DIAGNOSIS — E118 Type 2 diabetes mellitus with unspecified complications: Secondary | ICD-10-CM | POA: Diagnosis not present

## 2020-09-17 DIAGNOSIS — Z7902 Long term (current) use of antithrombotics/antiplatelets: Secondary | ICD-10-CM | POA: Diagnosis not present

## 2020-09-17 DIAGNOSIS — Z7984 Long term (current) use of oral hypoglycemic drugs: Secondary | ICD-10-CM | POA: Diagnosis not present

## 2020-09-17 DIAGNOSIS — R1310 Dysphagia, unspecified: Secondary | ICD-10-CM | POA: Diagnosis not present

## 2020-09-17 DIAGNOSIS — E785 Hyperlipidemia, unspecified: Secondary | ICD-10-CM | POA: Diagnosis not present

## 2020-09-17 DIAGNOSIS — I10 Essential (primary) hypertension: Secondary | ICD-10-CM | POA: Diagnosis not present

## 2020-09-17 DIAGNOSIS — I739 Peripheral vascular disease, unspecified: Secondary | ICD-10-CM | POA: Diagnosis not present

## 2020-09-17 DIAGNOSIS — F5101 Primary insomnia: Secondary | ICD-10-CM | POA: Diagnosis not present

## 2020-09-25 DIAGNOSIS — E119 Type 2 diabetes mellitus without complications: Secondary | ICD-10-CM | POA: Diagnosis not present

## 2020-09-25 DIAGNOSIS — D649 Anemia, unspecified: Secondary | ICD-10-CM | POA: Diagnosis not present

## 2020-09-25 DIAGNOSIS — K219 Gastro-esophageal reflux disease without esophagitis: Secondary | ICD-10-CM | POA: Diagnosis not present

## 2020-09-25 DIAGNOSIS — E785 Hyperlipidemia, unspecified: Secondary | ICD-10-CM | POA: Diagnosis not present

## 2020-09-25 DIAGNOSIS — I10 Essential (primary) hypertension: Secondary | ICD-10-CM | POA: Diagnosis not present

## 2020-09-25 DIAGNOSIS — E118 Type 2 diabetes mellitus with unspecified complications: Secondary | ICD-10-CM | POA: Diagnosis not present

## 2020-10-16 DIAGNOSIS — Z23 Encounter for immunization: Secondary | ICD-10-CM | POA: Diagnosis not present

## 2020-11-21 ENCOUNTER — Ambulatory Visit (INDEPENDENT_AMBULATORY_CARE_PROVIDER_SITE_OTHER): Payer: Medicare Other | Admitting: Obstetrics

## 2020-11-21 ENCOUNTER — Other Ambulatory Visit: Payer: Self-pay

## 2020-11-21 ENCOUNTER — Encounter: Payer: Self-pay | Admitting: Obstetrics

## 2020-11-21 VITALS — BP 139/72 | HR 63 | Wt 131.0 lb

## 2020-11-21 DIAGNOSIS — Z01419 Encounter for gynecological examination (general) (routine) without abnormal findings: Secondary | ICD-10-CM

## 2020-11-21 DIAGNOSIS — L989 Disorder of the skin and subcutaneous tissue, unspecified: Secondary | ICD-10-CM | POA: Diagnosis not present

## 2020-11-21 DIAGNOSIS — Z780 Asymptomatic menopausal state: Secondary | ICD-10-CM

## 2020-11-21 MED ORDER — CLOBETASOL PROPIONATE 0.05 % EX CREA: 1.0000 "application " | TOPICAL_CREAM | Freq: Two times a day (BID) | CUTANEOUS | 1 refills | Status: DC

## 2020-11-21 NOTE — Progress Notes (Signed)
Subjective:        Diana Garcia is a 79 y.o. female here for a routine exam.  Current complaints: None.    Personal health questionnaire:  Is patient Ashkenazi Jewish, have a family history of breast and/or ovarian cancer: yes Is there a family history of uterine cancer diagnosed at age < 6, gastrointestinal cancer, urinary tract cancer, family member who is a Personnel officer syndrome-associated carrier: no Is the patient overweight and hypertensive, family history of diabetes, personal history of gestational diabetes, preeclampsia or PCOS: no Is patient over 89, have PCOS,  family history of premature CHD under age 59, diabetes, smoke, have hypertension or peripheral artery disease:  no At any time, has a partner hit, kicked or otherwise hurt or frightened you?: no Over the past 2 weeks, have you felt down, depressed or hopeless?: no Over the past 2 weeks, have you felt little interest or pleasure in doing things?:no   Gynecologic History No LMP recorded. Patient is postmenopausal. Contraception: post menopausal status Last Pap: 2018. Results were: normal Last mammogram: 05-20-2020. Results were: normal  Obstetric History OB History  No obstetric history on file.    Past Medical History:  Diagnosis Date  . Closed fracture of fifth metatarsal bone   . Diabetes mellitus without complication (HCC)   . Hyperlipidemia   . Hypertension   . Insomnia   . Insomnia     Past Surgical History:  Procedure Laterality Date  . CATARACT EXTRACTION, BILATERAL Bilateral 01/2017  . LOWER EXTREMITY ANGIOGRAM N/A 05/16/2012   Procedure: LOWER EXTREMITY ANGIOGRAM;  Surgeon: Pamella Pert, MD;  Location: [facility] CATH LAB;  Service: Cardiovascular;  Laterality: N/A;     Current Outpatient Medications:  .  amLODipine (NORVASC) 10 MG tablet, Take 1 tablet by mouth  daily, Disp: 90 tablet, Rfl: 3 .  aspirin EC 81 MG tablet, Take 81 mg by mouth daily., Disp: , Rfl:  .  Calcium Carbonate-Vitamin D  (CALCIUM 600 + D PO), Take 1 tablet by mouth 3 (three) times a week. No specific days, Disp: , Rfl:  .  carvedilol (COREG) 3.125 MG tablet, Take 1 tablet (3.125 mg total) by mouth 2 (two) times daily with a meal., Disp: 180 tablet, Rfl: 3 .  Cholecalciferol (VITAMIN D PO), Take 4,000 Units by mouth 3 (three) times a week. No specific days, Disp: , Rfl:  .  clobetasol cream (TEMOVATE) 0.05 %, Apply 1 application topically 2 (two) times daily., Disp: 60 g, Rfl: 1 .  losartan (COZAAR) 25 MG tablet, Take 1 tablet (25 mg total) by mouth daily., Disp: 90 tablet, Rfl: 3 .  metFORMIN (GLUCOPHAGE) 500 MG tablet, Take 1 tablet by mouth  twice a day with meals, Disp: 180 tablet, Rfl: 3 .  niacin 500 MG tablet, Take 500 mg by mouth at bedtime., Disp: , Rfl:  .  rosuvastatin (CRESTOR) 20 MG tablet, Take 20 mg by mouth daily., Disp: , Rfl:  Allergies  Allergen Reactions  . Lisinopril     fainting  . Penicillins Hives  . Pletal [Cilostazol] Palpitations    Social History   Tobacco Use  . Smoking status: Never Smoker  . Smokeless tobacco: Never Used  Substance Use Topics  . Alcohol use: Yes    Alcohol/week: 0.0 standard drinks    Comment: Rarely    Family History  Problem Relation Age of Onset  . Hyperlipidemia Mother   . Hypertension Mother   . Diabetes Father   . Heart attack Father   .  Diabetes Sister   . Hypertension Sister   . Breast cancer Sister   . Diabetes Brother   . Sudden death Neg Hx       Review of Systems  Constitutional: negative for fatigue and weight loss Respiratory: negative for cough and wheezing Cardiovascular: negative for chest pain, fatigue and palpitations Gastrointestinal: negative for abdominal pain and change in bowel habits Musculoskeletal:negative for myalgias Neurological: negative for gait problems and tremors Behavioral/Psych: negative for abusive relationship, depression Endocrine: negative for temperature intolerance    Genitourinary:negative for  abnormal menstrual periods, genital lesions, hot flashes, sexual problems and vaginal discharge Integument/breast: negative for breast lump, breast tenderness, nipple discharge and skin lesion(s)    Objective:       BP 139/72   Pulse 63   Wt 131 lb (59.4 kg)   BMI 23.96 kg/m  General:   alert  Skin:   no rash or abnormalities  Lungs:   clear to auscultation bilaterally  Heart:   regular rate and rhythm, S1, S2 normal, no murmur, click, rub or gallop  Breasts:   normal without suspicious masses, skin or nipple changes or axillary nodes  Abdomen:  normal findings: no organomegaly, soft, non-tender and no hernia  Pelvis:  External genitalia: normal general appearance Urinary system: urethral meatus normal and bladder without fullness, nontender Vaginal: normal without tenderness, induration or masses Cervix: normal appearance Adnexa: normal bimanual exam Uterus: anteverted and non-tender, normal size   Lab Review Urine pregnancy test Labs reviewed yes Radiologic studies reviewed yes  50% of 20 min visit spent on counseling and coordination of care.   Assessment:     1. Encounter for gynecological examination - doing well  2. Dermatosis Rx: - clobetasol cream (TEMOVATE) 0.05 %; Apply 1 application topically 2 (two) times daily.  Dispense: 60 g; Refill: 1  3. Menopause    Plan:    Education reviewed: calcium supplements, depression evaluation, low fat, low cholesterol diet, safe sex/STD prevention, self breast exams and weight bearing exercise. Follow up in: 2 years.   Meds ordered this encounter  Medications  . clobetasol cream (TEMOVATE) 0.05 %    Sig: Apply 1 application topically 2 (two) times daily.    Dispense:  60 g    Refill:  1     Brock Bad, MD 11/21/2020 9:28 AM

## 2020-11-21 NOTE — Progress Notes (Signed)
GYN presents for AEX, reports no problems today.

## 2020-12-30 DIAGNOSIS — E785 Hyperlipidemia, unspecified: Secondary | ICD-10-CM | POA: Diagnosis not present

## 2020-12-30 DIAGNOSIS — E119 Type 2 diabetes mellitus without complications: Secondary | ICD-10-CM | POA: Diagnosis not present

## 2020-12-30 DIAGNOSIS — I10 Essential (primary) hypertension: Secondary | ICD-10-CM | POA: Diagnosis not present

## 2020-12-30 DIAGNOSIS — E118 Type 2 diabetes mellitus with unspecified complications: Secondary | ICD-10-CM | POA: Diagnosis not present

## 2020-12-30 DIAGNOSIS — D649 Anemia, unspecified: Secondary | ICD-10-CM | POA: Diagnosis not present

## 2020-12-30 DIAGNOSIS — K219 Gastro-esophageal reflux disease without esophagitis: Secondary | ICD-10-CM | POA: Diagnosis not present

## 2021-01-01 DIAGNOSIS — D649 Anemia, unspecified: Secondary | ICD-10-CM | POA: Diagnosis not present

## 2021-01-01 DIAGNOSIS — F5101 Primary insomnia: Secondary | ICD-10-CM | POA: Diagnosis not present

## 2021-02-17 DIAGNOSIS — D649 Anemia, unspecified: Secondary | ICD-10-CM | POA: Diagnosis not present

## 2021-02-17 DIAGNOSIS — I10 Essential (primary) hypertension: Secondary | ICD-10-CM | POA: Diagnosis not present

## 2021-02-17 DIAGNOSIS — K219 Gastro-esophageal reflux disease without esophagitis: Secondary | ICD-10-CM | POA: Diagnosis not present

## 2021-02-17 DIAGNOSIS — E118 Type 2 diabetes mellitus with unspecified complications: Secondary | ICD-10-CM | POA: Diagnosis not present

## 2021-02-17 DIAGNOSIS — E785 Hyperlipidemia, unspecified: Secondary | ICD-10-CM | POA: Diagnosis not present

## 2021-02-17 DIAGNOSIS — E119 Type 2 diabetes mellitus without complications: Secondary | ICD-10-CM | POA: Diagnosis not present

## 2021-03-20 DIAGNOSIS — E559 Vitamin D deficiency, unspecified: Secondary | ICD-10-CM | POA: Diagnosis not present

## 2021-03-20 DIAGNOSIS — F5101 Primary insomnia: Secondary | ICD-10-CM | POA: Diagnosis not present

## 2021-03-20 DIAGNOSIS — E1159 Type 2 diabetes mellitus with other circulatory complications: Secondary | ICD-10-CM | POA: Diagnosis not present

## 2021-03-20 DIAGNOSIS — Z23 Encounter for immunization: Secondary | ICD-10-CM | POA: Diagnosis not present

## 2021-03-20 DIAGNOSIS — I739 Peripheral vascular disease, unspecified: Secondary | ICD-10-CM | POA: Diagnosis not present

## 2021-03-20 DIAGNOSIS — K219 Gastro-esophageal reflux disease without esophagitis: Secondary | ICD-10-CM | POA: Diagnosis not present

## 2021-03-20 DIAGNOSIS — Z0000 Encounter for general adult medical examination without abnormal findings: Secondary | ICD-10-CM | POA: Diagnosis not present

## 2021-03-20 DIAGNOSIS — D649 Anemia, unspecified: Secondary | ICD-10-CM | POA: Diagnosis not present

## 2021-03-20 DIAGNOSIS — E118 Type 2 diabetes mellitus with unspecified complications: Secondary | ICD-10-CM | POA: Diagnosis not present

## 2021-03-20 DIAGNOSIS — I10 Essential (primary) hypertension: Secondary | ICD-10-CM | POA: Diagnosis not present

## 2021-03-20 DIAGNOSIS — E785 Hyperlipidemia, unspecified: Secondary | ICD-10-CM | POA: Diagnosis not present

## 2021-04-15 DIAGNOSIS — K219 Gastro-esophageal reflux disease without esophagitis: Secondary | ICD-10-CM | POA: Diagnosis not present

## 2021-04-15 DIAGNOSIS — D509 Iron deficiency anemia, unspecified: Secondary | ICD-10-CM | POA: Diagnosis not present

## 2021-04-23 DIAGNOSIS — I10 Essential (primary) hypertension: Secondary | ICD-10-CM | POA: Diagnosis not present

## 2021-04-23 DIAGNOSIS — K219 Gastro-esophageal reflux disease without esophagitis: Secondary | ICD-10-CM | POA: Diagnosis not present

## 2021-04-23 DIAGNOSIS — E1159 Type 2 diabetes mellitus with other circulatory complications: Secondary | ICD-10-CM | POA: Diagnosis not present

## 2021-04-23 DIAGNOSIS — E785 Hyperlipidemia, unspecified: Secondary | ICD-10-CM | POA: Diagnosis not present

## 2021-04-23 DIAGNOSIS — D509 Iron deficiency anemia, unspecified: Secondary | ICD-10-CM | POA: Diagnosis not present

## 2021-04-23 DIAGNOSIS — D649 Anemia, unspecified: Secondary | ICD-10-CM | POA: Diagnosis not present

## 2021-04-24 ENCOUNTER — Other Ambulatory Visit: Payer: Self-pay | Admitting: Family Medicine

## 2021-04-24 DIAGNOSIS — Z1231 Encounter for screening mammogram for malignant neoplasm of breast: Secondary | ICD-10-CM

## 2021-05-26 DIAGNOSIS — E1159 Type 2 diabetes mellitus with other circulatory complications: Secondary | ICD-10-CM | POA: Diagnosis not present

## 2021-05-26 DIAGNOSIS — I10 Essential (primary) hypertension: Secondary | ICD-10-CM | POA: Diagnosis not present

## 2021-05-26 DIAGNOSIS — D649 Anemia, unspecified: Secondary | ICD-10-CM | POA: Diagnosis not present

## 2021-05-26 DIAGNOSIS — K219 Gastro-esophageal reflux disease without esophagitis: Secondary | ICD-10-CM | POA: Diagnosis not present

## 2021-05-26 DIAGNOSIS — E785 Hyperlipidemia, unspecified: Secondary | ICD-10-CM | POA: Diagnosis not present

## 2021-05-26 DIAGNOSIS — D509 Iron deficiency anemia, unspecified: Secondary | ICD-10-CM | POA: Diagnosis not present

## 2021-06-06 DIAGNOSIS — H524 Presbyopia: Secondary | ICD-10-CM | POA: Diagnosis not present

## 2021-06-06 DIAGNOSIS — E119 Type 2 diabetes mellitus without complications: Secondary | ICD-10-CM | POA: Diagnosis not present

## 2021-06-06 DIAGNOSIS — Z961 Presence of intraocular lens: Secondary | ICD-10-CM | POA: Diagnosis not present

## 2021-06-06 DIAGNOSIS — H35363 Drusen (degenerative) of macula, bilateral: Secondary | ICD-10-CM | POA: Diagnosis not present

## 2021-06-06 DIAGNOSIS — H35033 Hypertensive retinopathy, bilateral: Secondary | ICD-10-CM | POA: Diagnosis not present

## 2021-06-23 ENCOUNTER — Ambulatory Visit
Admission: RE | Admit: 2021-06-23 | Discharge: 2021-06-23 | Disposition: A | Discharge status: Home / Self Care | Payer: Medicare Other | Source: Ambulatory Visit | Attending: Family Medicine | Admitting: Family Medicine

## 2021-06-23 ENCOUNTER — Other Ambulatory Visit: Payer: Self-pay

## 2021-06-23 DIAGNOSIS — Z1231 Encounter for screening mammogram for malignant neoplasm of breast: Secondary | ICD-10-CM

## 2021-07-10 DIAGNOSIS — D649 Anemia, unspecified: Secondary | ICD-10-CM | POA: Diagnosis not present

## 2021-07-10 DIAGNOSIS — K219 Gastro-esophageal reflux disease without esophagitis: Secondary | ICD-10-CM | POA: Diagnosis not present

## 2021-07-10 DIAGNOSIS — E118 Type 2 diabetes mellitus with unspecified complications: Secondary | ICD-10-CM | POA: Diagnosis not present

## 2021-07-10 DIAGNOSIS — I10 Essential (primary) hypertension: Secondary | ICD-10-CM | POA: Diagnosis not present

## 2021-07-10 DIAGNOSIS — E785 Hyperlipidemia, unspecified: Secondary | ICD-10-CM | POA: Diagnosis not present

## 2021-07-10 DIAGNOSIS — D509 Iron deficiency anemia, unspecified: Secondary | ICD-10-CM | POA: Diagnosis not present

## 2021-07-10 DIAGNOSIS — H35033 Hypertensive retinopathy, bilateral: Secondary | ICD-10-CM | POA: Diagnosis not present

## 2021-07-10 DIAGNOSIS — E1159 Type 2 diabetes mellitus with other circulatory complications: Secondary | ICD-10-CM | POA: Diagnosis not present

## 2021-07-22 DIAGNOSIS — M79671 Pain in right foot: Secondary | ICD-10-CM | POA: Diagnosis not present

## 2021-07-22 DIAGNOSIS — E118 Type 2 diabetes mellitus with unspecified complications: Secondary | ICD-10-CM | POA: Diagnosis not present

## 2021-07-22 DIAGNOSIS — I739 Peripheral vascular disease, unspecified: Secondary | ICD-10-CM | POA: Diagnosis not present

## 2021-07-22 DIAGNOSIS — E785 Hyperlipidemia, unspecified: Secondary | ICD-10-CM | POA: Diagnosis not present

## 2021-07-22 DIAGNOSIS — E1159 Type 2 diabetes mellitus with other circulatory complications: Secondary | ICD-10-CM | POA: Diagnosis not present

## 2021-07-22 DIAGNOSIS — K219 Gastro-esophageal reflux disease without esophagitis: Secondary | ICD-10-CM | POA: Diagnosis not present

## 2021-07-22 DIAGNOSIS — E559 Vitamin D deficiency, unspecified: Secondary | ICD-10-CM | POA: Diagnosis not present

## 2021-07-22 DIAGNOSIS — I10 Essential (primary) hypertension: Secondary | ICD-10-CM | POA: Diagnosis not present

## 2021-07-22 DIAGNOSIS — Z7902 Long term (current) use of antithrombotics/antiplatelets: Secondary | ICD-10-CM | POA: Diagnosis not present

## 2021-07-22 DIAGNOSIS — F5101 Primary insomnia: Secondary | ICD-10-CM | POA: Diagnosis not present

## 2021-09-18 ENCOUNTER — Ambulatory Visit: Payer: Medicare Other | Admitting: Cardiology

## 2021-09-19 ENCOUNTER — Ambulatory Visit: Payer: Medicare Other | Admitting: Cardiology

## 2021-09-19 ENCOUNTER — Encounter: Payer: Self-pay | Admitting: Cardiology

## 2021-09-19 ENCOUNTER — Other Ambulatory Visit: Payer: Self-pay

## 2021-09-19 VITALS — BP 127/65 | HR 59 | Temp 98.0°F | Resp 17 | Ht 62.0 in | Wt 131.4 lb

## 2021-09-19 DIAGNOSIS — I739 Peripheral vascular disease, unspecified: Secondary | ICD-10-CM

## 2021-09-19 DIAGNOSIS — I10 Essential (primary) hypertension: Secondary | ICD-10-CM | POA: Diagnosis not present

## 2021-09-19 DIAGNOSIS — I6523 Occlusion and stenosis of bilateral carotid arteries: Secondary | ICD-10-CM | POA: Diagnosis not present

## 2021-09-19 DIAGNOSIS — E7800 Pure hypercholesterolemia, unspecified: Secondary | ICD-10-CM

## 2021-09-19 NOTE — Progress Notes (Signed)
Primary Physician/Referring:  Ileana Ladd, MD  Patient ID: Diana Garcia, female    DOB: 1941/09/04, 80 y.o.   MRN: [id number]  Chief Complaint  Patient presents with   Follow-up    1 year   PAD   carotid stenosis    HPI:    Diana Garcia  is a 80 y.o.  African-American female with peripheral arterial disease and left SFA angioplasty in 2013, asymptomatic bilateral carotid artery stenosis, hypertension, DM and hyperlipidmia who presents here for annual follow up.    She has occasional cramping of her right calf.  He denies any chest pain, shortness of breath, PND or orthopnea. No symptoms to suggest TIA or amarousis.    Past Medical History:  Diagnosis Date   Closed fracture of fifth metatarsal bone    Diabetes mellitus without complication (HCC)    Hyperlipidemia    Hypertension    Insomnia    Insomnia    Past Surgical History:  Procedure Laterality Date   CATARACT EXTRACTION, BILATERAL Bilateral 01/2017   LOWER EXTREMITY ANGIOGRAM N/A 05/16/2012   Procedure: LOWER EXTREMITY ANGIOGRAM;  Surgeon: Pamella Pert, MD;  Location: [facility] CATH LAB;  Service: Cardiovascular;  Laterality: N/A;   Social History   Tobacco Use   Smoking status: Never   Smokeless tobacco: Never  Substance Use Topics   Alcohol use: Yes    Alcohol/week: 0.0 standard drinks    Comment: Rarely  Marital Status: Married    ROS  Review of Systems  Cardiovascular:  Positive for claudication. Negative for chest pain, dyspnea on exertion and leg swelling.  Gastrointestinal:  Negative for melena.  Objective   Vitals with BMI 09/19/2021 09/19/2021 11/21/2020  Height - 5\' 2"  -  Weight - 131 lbs 6 oz 131 lbs  BMI - 24.03 -  Systolic 127 129  Diastolic 65 57 72  Pulse 59 68 63    Blood pressure 127/65, pulse (!) 59, temperature 98 F (36.7 C), temperature source Temporal, resp. rate 17, height 5\' 2"  (1.575 m), weight 131 lb 6.4 oz (59.6 kg), SpO2 98 %. Body mass index is 24.03 kg/m.    Physical Exam Constitutional:      General: She is not in acute distress.    Comments: Moderately built and well-nourished, appears younger than stated age.  No acute distress.  Eyes:     Conjunctiva/sclera: Conjunctivae normal.  Neck:     Thyroid: No thyromegaly.     Vascular: No JVD.  Cardiovascular:     Rate and Rhythm: Normal rate and regular rhythm.     Pulses: Intact distal pulses.          Carotid pulses are 2+ on the right side and 2+ on the left side with bruit.      Popliteal pulses are 2+ on the right side and 2+ on the left side.       Dorsalis pedis pulses are 2+ on the right side and 2+ on the left side.       Posterior tibial pulses are 0 on the right side and 0 on the left side.     Heart sounds: S1 normal and S2 normal. Murmur heard.  Early systolic murmur is present with a grade of 2/6 at the upper right sternal border.    No gallop.     Comments: Very soft bruit left carotid. No JVD. No Pedal edema Pulmonary:     Effort: Pulmonary effort is normal.     Breath  sounds: Normal breath sounds.  Abdominal:     General: Bowel sounds are normal.     Palpations: Abdomen is soft.  Musculoskeletal:        General: Normal range of motion.     Cervical back: Neck supple.  Skin:    General: Skin is warm and dry.   Radiology: No results found.  Laboratory examination:   External labs:  Cholesterol, total 133.000 m 07/22/2021 HDL 46.000 mg 07/22/2021 LDL 71.000 mg 07/22/2021 Triglycerides 81.000 mg 07/22/2021  A1C 6.400 mg/ 07/22/2021  Hemoglobin 11.500 g/d 03/20/2021  Creatinine, Serum 0.820 mg/ 07/22/2021 Potassium 4.400 mm 07/22/2021 ALT (SGPT) 9.000 U/L 07/22/2021  Medications and allergies   Allergies  Allergen Reactions   Lisinopril     fainting   Penicillins Hives   Pletal [Cilostazol] Palpitations    Current Outpatient Medications on File Prior to Visit  Medication Sig Dispense Refill   amLODipine (NORVASC) 10 MG tablet Take 1 tablet by mouth  daily  90 tablet 3   aspirin EC 81 MG tablet Take 81 mg by mouth daily.     Calcium Carbonate-Vitamin D (CALCIUM 600 + D PO) Take 1 tablet by mouth 3 (three) times a week. No specific days     carvedilol (COREG) 3.125 MG tablet Take 1 tablet (3.125 mg total) by mouth 2 (two) times daily with a meal. 180 tablet 3   Cholecalciferol (VITAMIN D PO) Take 4,000 Units by mouth 3 (three) times a week. No specific days     clobetasol cream (TEMOVATE) 0.05 % Apply 1 application topically 2 (two) times daily. 60 g 1   losartan (COZAAR) 25 MG tablet Take 1 tablet (25 mg total) by mouth daily. 90 tablet 3   metFORMIN (GLUCOPHAGE) 500 MG tablet Take 1 tablet by mouth  twice a day with meals 180 tablet 3   niacin 500 MG tablet Take 500 mg by mouth at bedtime.     rosuvastatin (CRESTOR) 20 MG tablet Take 20 mg by mouth daily.     zolpidem (AMBIEN) 5 MG tablet Take 0.5 tablets by mouth 3 (three) times a week.     No current facility-administered medications on file prior to visit.    Cardiac Studies:   Peripheral arteriogram 05/16/12:  Left mid SFA 99% to 0%. 6x20mm Angiosculpt balloon PTA. Left AT occluded.  Lower extremity arterial duplex 12/12/2013: No hemodynamically significant stenosis is identified on either side. This exam reveals mildly decreased perfusion of the right lower extremity, noted at the post tibial artery level.This exam reveals mildly decreased perfusion of the left lower extremity, noted at the post tibial artery level. Right ABI 0.91 and left ABI 0.91. Compared to the study done on 06/26/13, RABI 0.88 and LABI 0.92., no significant change. Study suggests patent left PTA site. Recheck in 1 year if clinically indicated.  Carotid artery duplex  08/03/2019: Stenosis in the right internal carotid artery (50-69%). Stenosis in the left internal carotid artery (50-69%). Tortuous bilateral ICA.  Antegrade right vertebral artery flow. Antegrade left vertebral artery flow. No significant change from  02/08/2019. Follow up in six months is appropriate if clinically indicated.  EKG:    EKG 09/19/2021: Normal sinus rhythm at rate of 56 bpm, normal axis, no evidence of ischemia, normal EKG. no significant change from 09/12/2020.   Assessment     ICD-10-CM   1. Peripheral artery disease (HCC)  I73.9     2. Asymptomatic bilateral carotid artery stenosis  I65.23 PCV CAROTID DUPLEX (BILATERAL)  3. Essential hypertension  I10 EKG 12-Lead    4. Pure hypercholesterolemia  E78.00     No orders of the defined types were placed in this encounter.  There are no discontinued medications.     Recommendations:   Diana Garcia  is a 80 y.o.  African-American female with peripheral arterial disease and left SFA angioplasty in 2013, asymptomatic bilateral carotid artery stenosis, hypertension, DM and hyperlipidmia who presents here for annual follow up.    Patient essentially asymptomatic with very minimal symptoms of right leg/right calf claudication.  Vascular examination is completely normal, bilateral carotid bruit is still heard.  I reviewed her external labs, lipids are under excellent control, diabetes is under control and her blood pressure today is also very well controlled.  No changes in the medications were done today.  She needs continued carotid artery surveillance.  Unless new symptoms, I will see her back in a year.   Diana Decamp, MD, [facility] 09/19/2021, 8:56 AM Office: [phone]

## 2021-09-29 ENCOUNTER — Ambulatory Visit: Payer: Medicare Other

## 2021-09-29 ENCOUNTER — Other Ambulatory Visit: Payer: Self-pay

## 2021-09-29 DIAGNOSIS — I6523 Occlusion and stenosis of bilateral carotid arteries: Secondary | ICD-10-CM

## 2021-09-30 NOTE — Progress Notes (Signed)
Let the patient know that the right carotid artery has about a 70% stenosis to 75% stenosis, very mild progression compared to September 2020 hence I will be repeating this test again in 6 months.

## 2021-10-22 NOTE — Progress Notes (Signed)
Called patient, NA, LMAM

## 2021-10-27 NOTE — Progress Notes (Signed)
Called and spoke with patient regarding her CAD results.  Patient would like a call to make her 6 months appointment to repeat her CADuplex. Patient is aware someone will call her back and will wait for the call.

## 2021-11-04 DIAGNOSIS — Z23 Encounter for immunization: Secondary | ICD-10-CM | POA: Diagnosis not present

## 2021-11-04 DIAGNOSIS — M79671 Pain in right foot: Secondary | ICD-10-CM | POA: Diagnosis not present

## 2021-11-04 DIAGNOSIS — K219 Gastro-esophageal reflux disease without esophagitis: Secondary | ICD-10-CM | POA: Diagnosis not present

## 2021-11-04 DIAGNOSIS — F5101 Primary insomnia: Secondary | ICD-10-CM | POA: Diagnosis not present

## 2021-11-04 DIAGNOSIS — I10 Essential (primary) hypertension: Secondary | ICD-10-CM | POA: Diagnosis not present

## 2021-11-04 DIAGNOSIS — L84 Corns and callosities: Secondary | ICD-10-CM | POA: Diagnosis not present

## 2021-11-04 DIAGNOSIS — E1159 Type 2 diabetes mellitus with other circulatory complications: Secondary | ICD-10-CM | POA: Diagnosis not present

## 2021-11-04 DIAGNOSIS — E118 Type 2 diabetes mellitus with unspecified complications: Secondary | ICD-10-CM | POA: Diagnosis not present

## 2021-11-04 DIAGNOSIS — Z7902 Long term (current) use of antithrombotics/antiplatelets: Secondary | ICD-10-CM | POA: Diagnosis not present

## 2021-11-04 DIAGNOSIS — E785 Hyperlipidemia, unspecified: Secondary | ICD-10-CM | POA: Diagnosis not present

## 2021-11-04 DIAGNOSIS — E559 Vitamin D deficiency, unspecified: Secondary | ICD-10-CM | POA: Diagnosis not present

## 2021-11-04 DIAGNOSIS — I739 Peripheral vascular disease, unspecified: Secondary | ICD-10-CM | POA: Diagnosis not present

## 2021-11-05 DIAGNOSIS — E1159 Type 2 diabetes mellitus with other circulatory complications: Secondary | ICD-10-CM | POA: Diagnosis not present

## 2021-11-05 DIAGNOSIS — I10 Essential (primary) hypertension: Secondary | ICD-10-CM | POA: Diagnosis not present

## 2021-11-05 DIAGNOSIS — E118 Type 2 diabetes mellitus with unspecified complications: Secondary | ICD-10-CM | POA: Diagnosis not present

## 2021-11-05 DIAGNOSIS — K219 Gastro-esophageal reflux disease without esophagitis: Secondary | ICD-10-CM | POA: Diagnosis not present

## 2021-11-05 DIAGNOSIS — H35033 Hypertensive retinopathy, bilateral: Secondary | ICD-10-CM | POA: Diagnosis not present

## 2021-11-05 DIAGNOSIS — D649 Anemia, unspecified: Secondary | ICD-10-CM | POA: Diagnosis not present

## 2021-11-05 DIAGNOSIS — E785 Hyperlipidemia, unspecified: Secondary | ICD-10-CM | POA: Diagnosis not present

## 2021-11-05 DIAGNOSIS — D509 Iron deficiency anemia, unspecified: Secondary | ICD-10-CM | POA: Diagnosis not present

## 2021-11-13 ENCOUNTER — Other Ambulatory Visit: Payer: Self-pay | Admitting: Obstetrics

## 2021-11-13 DIAGNOSIS — L989 Disorder of the skin and subcutaneous tissue, unspecified: Secondary | ICD-10-CM

## 2021-11-26 DIAGNOSIS — M2042 Other hammer toe(s) (acquired), left foot: Secondary | ICD-10-CM | POA: Diagnosis not present

## 2021-11-26 DIAGNOSIS — M2041 Other hammer toe(s) (acquired), right foot: Secondary | ICD-10-CM | POA: Diagnosis not present

## 2021-11-26 DIAGNOSIS — L84 Corns and callosities: Secondary | ICD-10-CM | POA: Diagnosis not present

## 2021-11-26 DIAGNOSIS — M205X1 Other deformities of toe(s) (acquired), right foot: Secondary | ICD-10-CM | POA: Diagnosis not present

## 2021-11-26 DIAGNOSIS — M898X7 Other specified disorders of bone, ankle and foot: Secondary | ICD-10-CM | POA: Diagnosis not present

## 2021-12-17 DIAGNOSIS — Z23 Encounter for immunization: Secondary | ICD-10-CM | POA: Diagnosis not present

## 2022-03-03 DIAGNOSIS — Z6823 Body mass index (BMI) 23.0-23.9, adult: Secondary | ICD-10-CM | POA: Diagnosis not present

## 2022-03-03 DIAGNOSIS — E785 Hyperlipidemia, unspecified: Secondary | ICD-10-CM | POA: Diagnosis not present

## 2022-03-03 DIAGNOSIS — E559 Vitamin D deficiency, unspecified: Secondary | ICD-10-CM | POA: Diagnosis not present

## 2022-03-03 DIAGNOSIS — I10 Essential (primary) hypertension: Secondary | ICD-10-CM | POA: Diagnosis not present

## 2022-03-03 DIAGNOSIS — K219 Gastro-esophageal reflux disease without esophagitis: Secondary | ICD-10-CM | POA: Diagnosis not present

## 2022-03-03 DIAGNOSIS — F5101 Primary insomnia: Secondary | ICD-10-CM | POA: Diagnosis not present

## 2022-03-03 DIAGNOSIS — E118 Type 2 diabetes mellitus with unspecified complications: Secondary | ICD-10-CM | POA: Diagnosis not present

## 2022-03-03 DIAGNOSIS — E1159 Type 2 diabetes mellitus with other circulatory complications: Secondary | ICD-10-CM | POA: Diagnosis not present

## 2022-03-03 DIAGNOSIS — I739 Peripheral vascular disease, unspecified: Secondary | ICD-10-CM | POA: Diagnosis not present

## 2022-03-03 DIAGNOSIS — M79671 Pain in right foot: Secondary | ICD-10-CM | POA: Diagnosis not present

## 2022-03-03 DIAGNOSIS — Z7902 Long term (current) use of antithrombotics/antiplatelets: Secondary | ICD-10-CM | POA: Diagnosis not present

## 2022-03-25 DIAGNOSIS — I10 Essential (primary) hypertension: Secondary | ICD-10-CM | POA: Diagnosis not present

## 2022-03-25 DIAGNOSIS — E785 Hyperlipidemia, unspecified: Secondary | ICD-10-CM | POA: Diagnosis not present

## 2022-03-25 DIAGNOSIS — E1159 Type 2 diabetes mellitus with other circulatory complications: Secondary | ICD-10-CM | POA: Diagnosis not present

## 2022-03-25 DIAGNOSIS — K219 Gastro-esophageal reflux disease without esophagitis: Secondary | ICD-10-CM | POA: Diagnosis not present

## 2022-03-26 DIAGNOSIS — Z1389 Encounter for screening for other disorder: Secondary | ICD-10-CM | POA: Diagnosis not present

## 2022-03-26 DIAGNOSIS — Z0000 Encounter for general adult medical examination without abnormal findings: Secondary | ICD-10-CM | POA: Diagnosis not present

## 2022-05-12 DIAGNOSIS — U071 COVID-19: Secondary | ICD-10-CM | POA: Diagnosis not present

## 2022-06-04 ENCOUNTER — Other Ambulatory Visit: Payer: Self-pay | Admitting: Cardiology

## 2022-06-04 DIAGNOSIS — Z1231 Encounter for screening mammogram for malignant neoplasm of breast: Secondary | ICD-10-CM

## 2022-06-09 DIAGNOSIS — H43812 Vitreous degeneration, left eye: Secondary | ICD-10-CM | POA: Diagnosis not present

## 2022-06-09 DIAGNOSIS — H35363 Drusen (degenerative) of macula, bilateral: Secondary | ICD-10-CM | POA: Diagnosis not present

## 2022-06-09 DIAGNOSIS — E119 Type 2 diabetes mellitus without complications: Secondary | ICD-10-CM | POA: Diagnosis not present

## 2022-06-09 DIAGNOSIS — H35033 Hypertensive retinopathy, bilateral: Secondary | ICD-10-CM | POA: Diagnosis not present

## 2022-06-24 ENCOUNTER — Ambulatory Visit
Admission: RE | Admit: 2022-06-24 | Discharge: 2022-06-24 | Disposition: A | Discharge status: Home / Self Care | Payer: Medicare Other | Source: Ambulatory Visit | Attending: Cardiology | Admitting: Cardiology

## 2022-06-24 DIAGNOSIS — Z1231 Encounter for screening mammogram for malignant neoplasm of breast: Secondary | ICD-10-CM

## 2022-06-25 DIAGNOSIS — E785 Hyperlipidemia, unspecified: Secondary | ICD-10-CM | POA: Diagnosis not present

## 2022-06-25 DIAGNOSIS — I10 Essential (primary) hypertension: Secondary | ICD-10-CM | POA: Diagnosis not present

## 2022-06-25 DIAGNOSIS — E1159 Type 2 diabetes mellitus with other circulatory complications: Secondary | ICD-10-CM | POA: Diagnosis not present

## 2022-07-31 DIAGNOSIS — R1111 Vomiting without nausea: Secondary | ICD-10-CM | POA: Diagnosis not present

## 2022-07-31 DIAGNOSIS — K219 Gastro-esophageal reflux disease without esophagitis: Secondary | ICD-10-CM | POA: Diagnosis not present

## 2022-08-25 DIAGNOSIS — L80 Vitiligo: Secondary | ICD-10-CM | POA: Diagnosis not present

## 2022-08-25 DIAGNOSIS — E119 Type 2 diabetes mellitus without complications: Secondary | ICD-10-CM | POA: Diagnosis not present

## 2022-08-25 DIAGNOSIS — E7800 Pure hypercholesterolemia, unspecified: Secondary | ICD-10-CM | POA: Diagnosis not present

## 2022-08-25 DIAGNOSIS — Z01419 Encounter for gynecological examination (general) (routine) without abnormal findings: Secondary | ICD-10-CM | POA: Diagnosis not present

## 2022-08-25 DIAGNOSIS — M8580 Other specified disorders of bone density and structure, unspecified site: Secondary | ICD-10-CM | POA: Diagnosis not present

## 2022-08-25 DIAGNOSIS — I10 Essential (primary) hypertension: Secondary | ICD-10-CM | POA: Diagnosis not present

## 2022-08-27 ENCOUNTER — Other Ambulatory Visit: Payer: Self-pay | Admitting: Obstetrics and Gynecology

## 2022-08-27 DIAGNOSIS — M8580 Other specified disorders of bone density and structure, unspecified site: Secondary | ICD-10-CM

## 2022-09-18 ENCOUNTER — Ambulatory Visit: Payer: Medicare Other | Admitting: Cardiology

## 2022-09-18 ENCOUNTER — Encounter: Payer: Self-pay | Admitting: Cardiology

## 2022-09-18 VITALS — BP 127/65 | HR 68 | Temp 97.7°F | Resp 17 | Ht 62.0 in | Wt 125.0 lb

## 2022-09-18 DIAGNOSIS — I739 Peripheral vascular disease, unspecified: Secondary | ICD-10-CM

## 2022-09-18 DIAGNOSIS — I6523 Occlusion and stenosis of bilateral carotid arteries: Secondary | ICD-10-CM | POA: Diagnosis not present

## 2022-09-18 DIAGNOSIS — E7800 Pure hypercholesterolemia, unspecified: Secondary | ICD-10-CM

## 2022-09-18 DIAGNOSIS — I10 Essential (primary) hypertension: Secondary | ICD-10-CM | POA: Diagnosis not present

## 2022-09-18 NOTE — Progress Notes (Signed)
Primary Physician/Referring:  Vernie Shanks, MD (Inactive)  Patient ID: Diana Garcia, female    DOB: 1941-09-13, 81 y.o.   MRN: [id number]  Chief Complaint  Patient presents with   PAD   carotid stenosis   Follow-up    1 year    HPI:    Diana Garcia  is a 81 y.o.  African-American female with peripheral arterial disease and left SFA angioplasty in 2013, asymptomatic bilateral carotid artery stenosis, hypertension, DM and hyperlipidmia who presents here for annual follow up.    She has occasional cramping of her right calf with ambulation.  He denies any chest pain, shortness of breath, PND or orthopnea. No symptoms to suggest TIA or amarousis. He remains active and walks daily. She does travel often and wears compression stockings during travel.  Past Medical History:  Diagnosis Date   Closed fracture of fifth metatarsal bone    Diabetes mellitus without complication ([place])    Hyperlipidemia    Hypertension    Insomnia    Insomnia    Past Surgical History:  Procedure Laterality Date   CATARACT EXTRACTION, BILATERAL Bilateral 01/2017   LOWER EXTREMITY ANGIOGRAM N/A 05/16/2012   Procedure: LOWER EXTREMITY ANGIOGRAM;  Surgeon: Laverda Page, MD;  Location: [facility] CATH LAB;  Service: Cardiovascular;  Laterality: N/A;   Social History   Tobacco Use   Smoking status: Never   Smokeless tobacco: Never  Substance Use Topics   Alcohol use: Yes    Alcohol/week: 0.0 standard drinks of alcohol    Comment: Rarely  Marital Status: Married    ROS  Review of Systems  Cardiovascular:  Positive for claudication. Negative for chest pain, dyspnea on exertion and leg swelling.  Gastrointestinal:  Negative for melena.  Neurological:  Negative for dizziness and light-headedness.   Objective      09/18/2022    8:26 AM 09/19/2021    8:41 AM 09/19/2021    8:30 AM  Vitals with BMI  Height 5\' 2"   5\' 2"   Weight 125 lbs  131 lbs 6 oz  BMI [id number]  0000000  Systolic [id number]   Diastolic 65 65 57  Pulse 68 59 68    Blood pressure 127/65, pulse 68, temperature 97.7 F (36.5 C), temperature source Temporal, resp. rate 17, height 5\' 2"  (1.575 m), weight 125 lb (56.7 kg), SpO2 99 %. Body mass index is 22.86 kg/m.   Physical Exam Constitutional:      General: She is not in acute distress.    Comments: Moderately built and well-nourished, appears younger than stated age.  No acute distress.  Eyes:     Conjunctiva/sclera: Conjunctivae normal.  Neck:     Thyroid: No thyromegaly.     Vascular: No JVD.  Cardiovascular:     Rate and Rhythm: Normal rate and regular rhythm.     Pulses: Intact distal pulses.          Carotid pulses are 2+ on the right side and 2+ on the left side with bruit.      Radial pulses are 2+ on the right side and 2+ on the left side.       Femoral pulses are 1+ on the right side and 1+ on the left side.      Popliteal pulses are 2+ on the right side and 2+ on the left side.       Dorsalis pedis pulses are 2+ on the right side and 2+ on the left side.  Posterior tibial pulses are 0 on the right side and 0 on the left side.     Heart sounds: S1 normal and S2 normal. Murmur heard.     Early systolic murmur is present with a grade of 2/6 at the upper right sternal border.     No gallop.     Comments: Very soft bruit left carotid. No JVD. No Pedal edema Pulmonary:     Effort: Pulmonary effort is normal.     Breath sounds: Normal breath sounds.  Abdominal:     General: Bowel sounds are normal.     Palpations: Abdomen is soft.  Musculoskeletal:        General: Normal range of motion.     Cervical back: Neck supple.  Skin:    General: Skin is warm and dry.    Radiology: No results found.  Laboratory examination:   External labs:  Cholesterol, total 140.000 m 03/03/2022 HDL 54.000 mg 03/03/2022 LDL 72.000 mg 03/03/2022 Triglycerides 65.000 mg 03/03/2022  A1C 6.300 % 03/03/2022  Hemoglobin 11.500 g/d 03/03/2022  Creatinine, Serum 0.760  mg/ 03/03/2022 Potassium 4.800 mm 03/03/2022 Magnesium N/D ALT (SGPT) 11.000 U/L 03/03/2022   Medications and allergies   Allergies  Allergen Reactions   Lisinopril     fainting   Penicillins Hives   Pletal [Cilostazol] Palpitations    Current Outpatient Medications on File Prior to Visit  Medication Sig Dispense Refill   amLODipine (NORVASC) 10 MG tablet Take 1 tablet by mouth  daily 90 tablet 3   aspirin EC 81 MG tablet Take 81 mg by mouth daily.     Calcium Carbonate-Vitamin D (CALCIUM 600 + D PO) Take 1 tablet by mouth 3 (three) times a week. No specific days     carvedilol (COREG) 3.125 MG tablet Take 1 tablet (3.125 mg total) by mouth 2 (two) times daily with a meal. 180 tablet 3   Cholecalciferol (VITAMIN D PO) Take 4,000 Units by mouth 3 (three) times a week. No specific days     clobetasol cream (TEMOVATE) 0.05 % APPLY TOPICALLY TWICE DAILY 30 g 1   losartan (COZAAR) 25 MG tablet Take 1 tablet (25 mg total) by mouth daily. 90 tablet 3   metFORMIN (GLUCOPHAGE) 500 MG tablet Take 1 tablet by mouth  twice a day with meals 180 tablet 3   niacin 500 MG tablet Take 500 mg by mouth at bedtime.     omeprazole (PRILOSEC) 20 MG capsule 1 capsule 30 minutes before morning meal     rosuvastatin (CRESTOR) 20 MG tablet Take 20 mg by mouth daily.     zolpidem (AMBIEN) 5 MG tablet Take 0.5 tablets by mouth 3 (three) times a week.     No current facility-administered medications on file prior to visit.    Cardiac Studies:   Peripheral arteriogram 05/16/12:  Left mid SFA 99% to 0%. 6x4mm Angiosculpt balloon PTA. Left AT occluded.  Lower extremity arterial duplex 12/12/2013: No hemodynamically significant stenosis is identified on either side. This exam reveals mildly decreased perfusion of the right lower extremity, noted at the post tibial artery level.This exam reveals mildly decreased perfusion of the left lower extremity, noted at the post tibial artery level. Right ABI 0.91 and left ABI  0.91. Compared to the study done on 06/26/13, RABI 0.88 and LABI 0.92., no significant change. Study suggests patent left PTA site. Recheck in 1 year if clinically indicated.  Carotid artery duplex 09/29/2021:  Duplex suggests stenosis in the right internal  carotid artery (>=70%). The right PSV internal/common carotid artery ratio of 4.23 is consistent with a stenosis of >70%. Duplex suggests stenosis in the left internal carotid artery (16-49%). Bilateral homogeneous plaque noted.  Compared to 08/03/2019, there is mild progression of the disease on the right. Follow up in six months is appropriate if clinically indicated  EKG:    EKG 09/18/2022: Sinus rhythm at rate of 65 bpm.  Normal axis.  No evidence of ischemia or underlying injury pattern.  Compared to previous EKG on 09/19/2021, no significant change.  Assessment     ICD-10-CM   1. Peripheral artery disease (HCC)  I73.9 EKG 12-Lead    PCV LOWER ARTERIAL (BILATERAL)    2. Asymptomatic bilateral carotid artery stenosis  I65.23     3. Essential hypertension  I10 C-reactive protein    4. Pure hypercholesterolemia  E78.00 Lipid Panel With LDL/HDL Ratio    C-reactive protein    Lipoprotein A (LPA)    No orders of the defined types were placed in this encounter.  There are no discontinued medications.     Recommendations:   Diana Garcia  is a 81 y.o.  African-American female with peripheral arterial disease and left SFA angioplasty in 2013, asymptomatic bilateral carotid artery stenosis, hypertension, DM and hyperlipidmia who presents here for annual follow up.    Peripheral artery disease (HCC) Will schedule for lower extremity arterial duplex. She has mild claudication symptoms in right calf that has not worsened or changed.  Asymptomatic bilateral carotid artery stenosis Soft left bruit appreciated on exam. Will schedule for carotid artery duplex. Needs continued surveillance.  Essential hypertension Blood pressure is  well controlled, no changes to medications at this time.  Pure hypercholesterolemia Reviewed external labs, lipid are under good control. Discussed recheck of lipid panel and other labs including CRP and LpA, she does have an appointment scheduled with PCP next Tuesday and will have these labs drawn along with other lab work at that time.  Follow-up in 1 year or sooner if needed.   Ernst Spell, AGNP-C 09/18/2022, 8:55 AM Office: [phone]

## 2022-09-29 DIAGNOSIS — E785 Hyperlipidemia, unspecified: Secondary | ICD-10-CM | POA: Diagnosis not present

## 2022-09-29 DIAGNOSIS — G4700 Insomnia, unspecified: Secondary | ICD-10-CM | POA: Diagnosis not present

## 2022-09-29 DIAGNOSIS — Z6823 Body mass index (BMI) 23.0-23.9, adult: Secondary | ICD-10-CM | POA: Diagnosis not present

## 2022-09-29 DIAGNOSIS — E1159 Type 2 diabetes mellitus with other circulatory complications: Secondary | ICD-10-CM | POA: Diagnosis not present

## 2022-09-29 DIAGNOSIS — I10 Essential (primary) hypertension: Secondary | ICD-10-CM | POA: Diagnosis not present

## 2022-09-29 DIAGNOSIS — R051 Acute cough: Secondary | ICD-10-CM | POA: Diagnosis not present

## 2022-09-29 DIAGNOSIS — Z23 Encounter for immunization: Secondary | ICD-10-CM | POA: Diagnosis not present

## 2022-10-02 ENCOUNTER — Ambulatory Visit: Payer: Medicare Other

## 2022-10-02 DIAGNOSIS — I739 Peripheral vascular disease, unspecified: Secondary | ICD-10-CM

## 2022-10-02 DIAGNOSIS — I6523 Occlusion and stenosis of bilateral carotid arteries: Secondary | ICD-10-CM

## 2022-10-09 NOTE — Progress Notes (Signed)
Will recheck in 6 months. Overall stable carotid study

## 2022-10-12 DIAGNOSIS — D509 Iron deficiency anemia, unspecified: Secondary | ICD-10-CM | POA: Diagnosis not present

## 2022-10-15 NOTE — Progress Notes (Signed)
Called patient no answer, left a vm

## 2022-10-16 NOTE — Progress Notes (Signed)
Patient stated that someone gave her a call yesterday.

## 2022-11-18 DIAGNOSIS — D509 Iron deficiency anemia, unspecified: Secondary | ICD-10-CM | POA: Diagnosis not present

## 2022-12-24 DIAGNOSIS — L84 Corns and callosities: Secondary | ICD-10-CM | POA: Diagnosis not present

## 2022-12-24 DIAGNOSIS — I739 Peripheral vascular disease, unspecified: Secondary | ICD-10-CM | POA: Diagnosis not present

## 2022-12-24 DIAGNOSIS — M205X1 Other deformities of toe(s) (acquired), right foot: Secondary | ICD-10-CM | POA: Diagnosis not present

## 2022-12-26 IMAGING — MG MM DIGITAL SCREENING BILAT W/ TOMO AND CAD
8 series · 8 of 24 positions shown · non-contrast
Comparison: Previous exam(s).

CLINICAL DATA: Screening.

EXAM:
DIGITAL SCREENING BILATERAL MAMMOGRAM WITH TOMOSYNTHESIS AND CAD
TECHNIQUE: Bilateral screening digital craniocaudal and mediolateral oblique
mammograms were obtained. Bilateral screening digital breast
tomosynthesis was performed. The images were evaluated with
computer-aided detection.

[L MLO synth-2D]
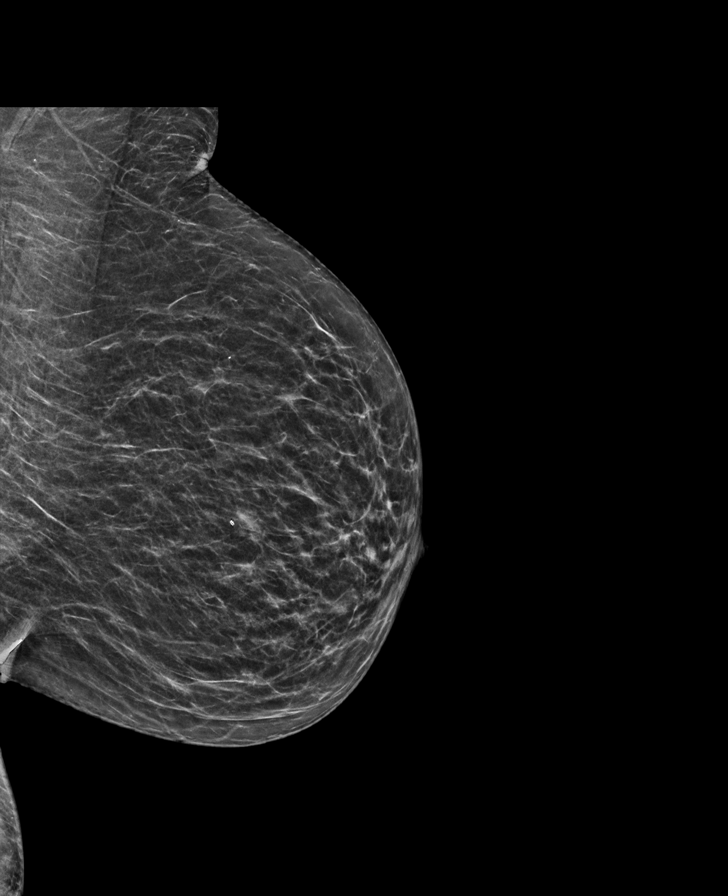

[R MLO synth-2D]
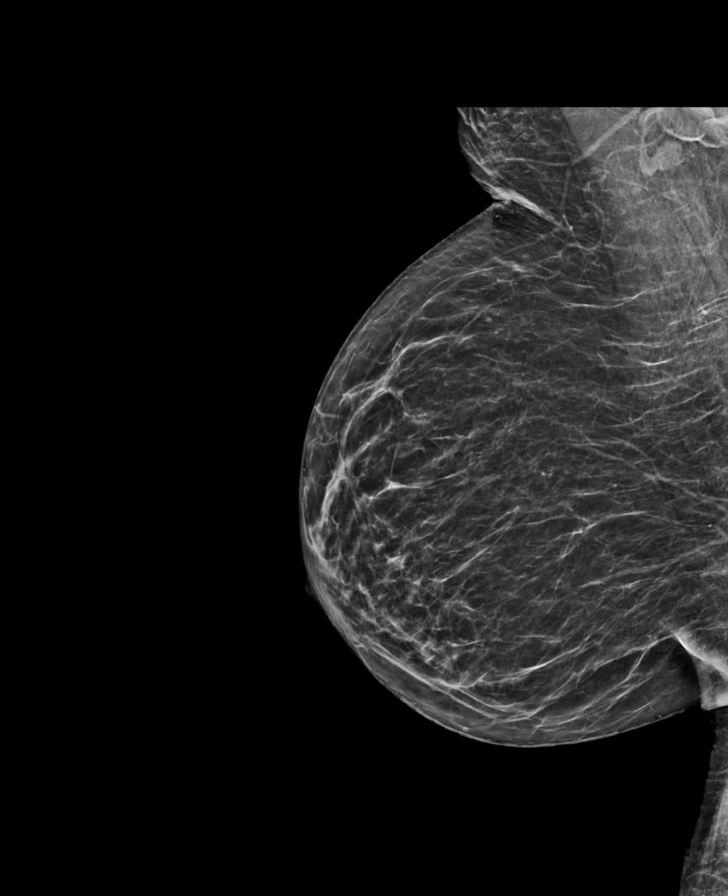

[L CC synth-2D]
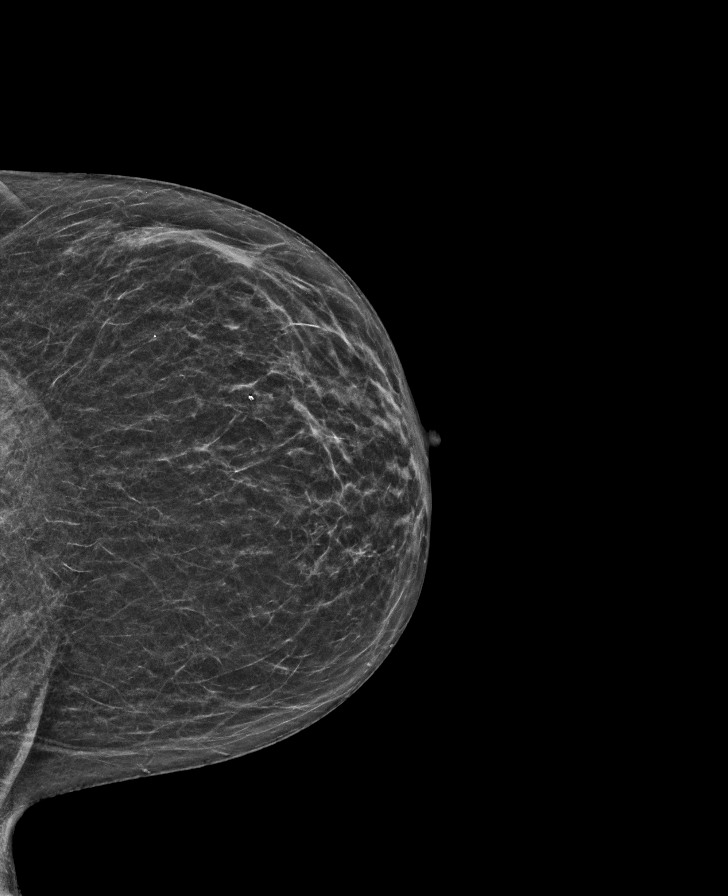

[R CC synth-2D]
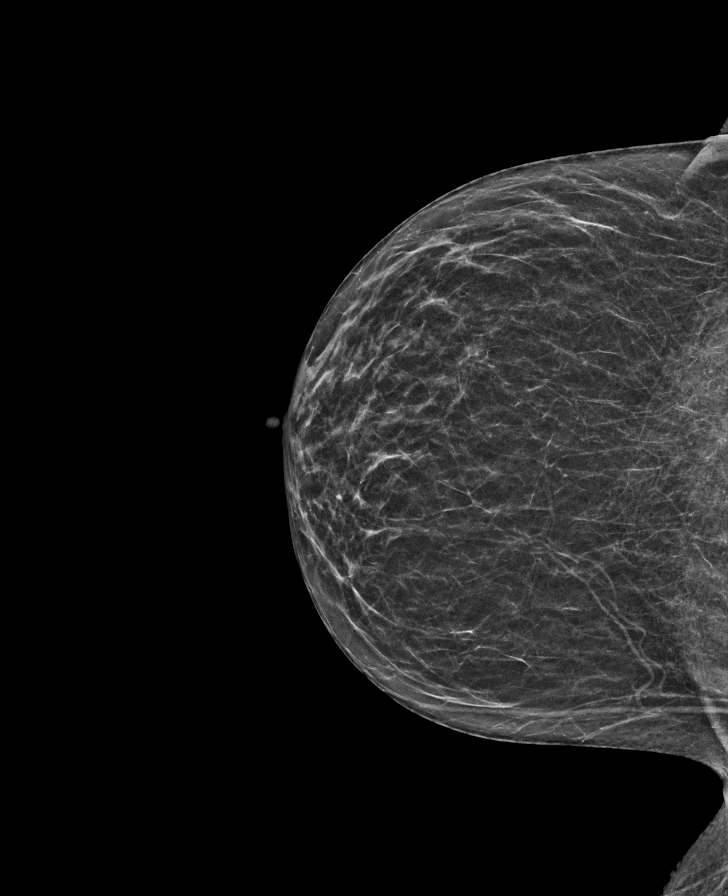

[R CC tomo · tomo slice 23/46.0]
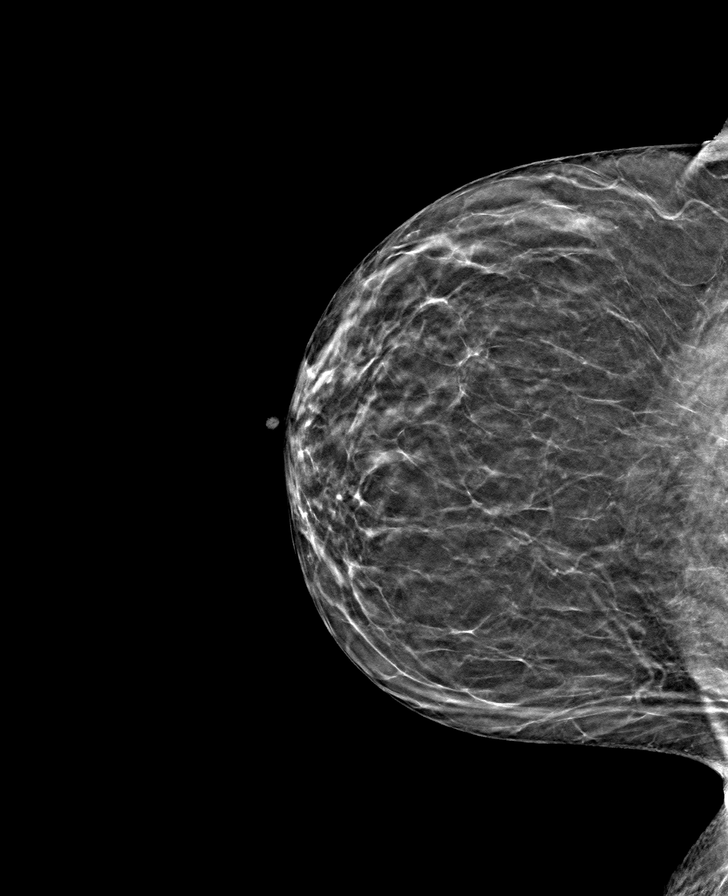

[L CC tomo · tomo slice 23/46.0]
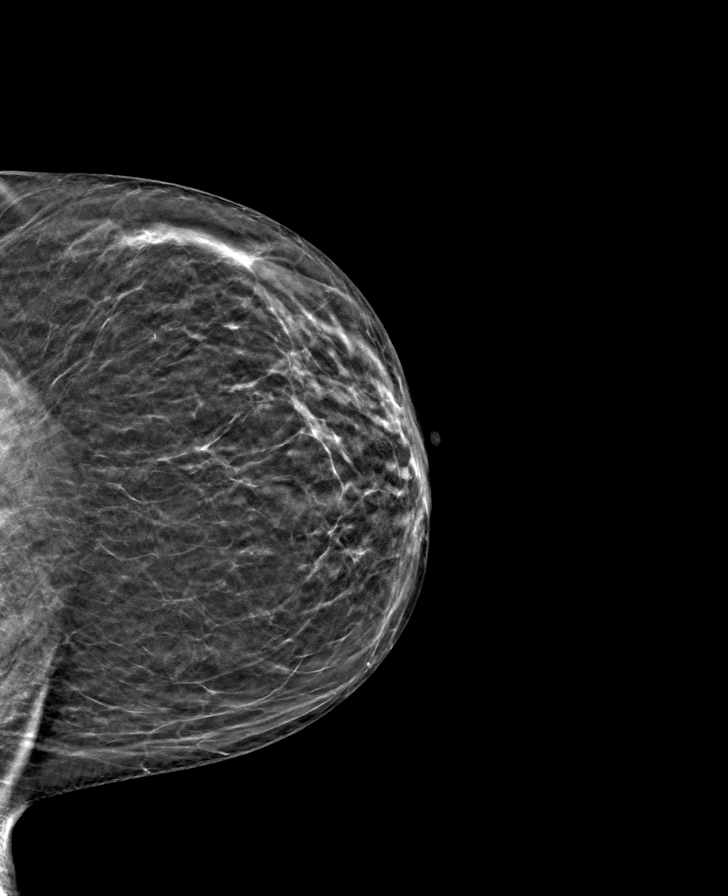

[R MLO tomo · tomo slice 28/55.0]
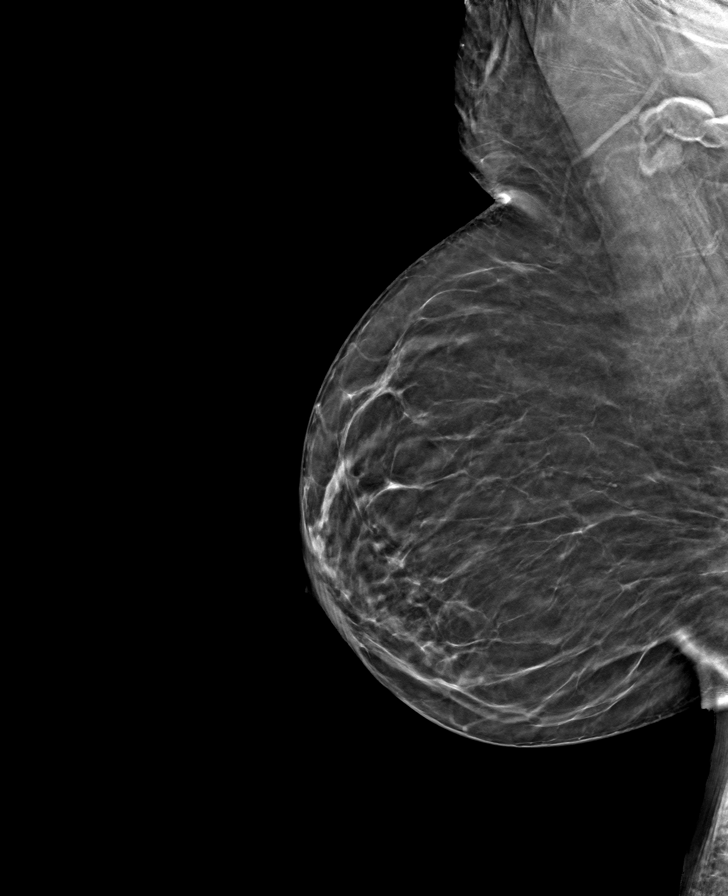

[L MLO tomo · tomo slice 26/51.0]
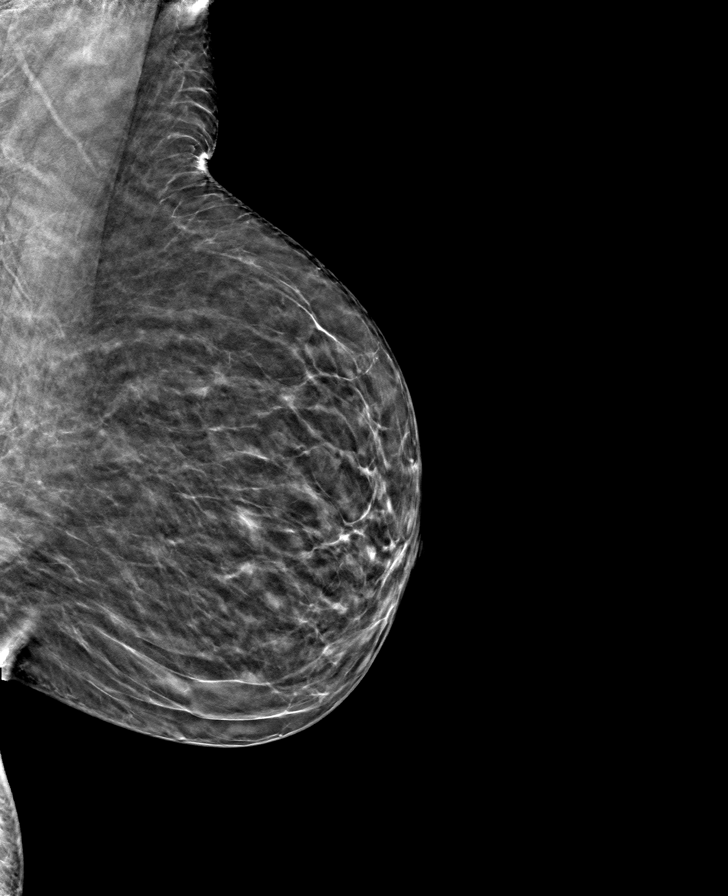

[8 of 24 positions shown; findings below may reference images not displayed]

ACR Breast Density Category b: There are scattered areas of
fibroglandular density.
FINDINGS: There are no findings suspicious for malignancy.
IMPRESSION: No mammographic evidence of malignancy. A result letter of this
screening mammogram will be mailed directly to the patient.

RECOMMENDATION:
Screening mammogram in one year. (Code:51-O-LD2)

BI-RADS CATEGORY  1: Negative.

## 2023-02-08 ENCOUNTER — Inpatient Hospital Stay: Admission: RE | Admit: 2023-02-08 | Payer: Medicare Other | Source: Ambulatory Visit

## 2023-02-09 ENCOUNTER — Ambulatory Visit
Admission: RE | Admit: 2023-02-09 | Discharge: 2023-02-09 | Disposition: A | Discharge status: Home / Self Care | Payer: Medicare Other | Source: Ambulatory Visit | Attending: Obstetrics and Gynecology | Admitting: Obstetrics and Gynecology

## 2023-02-09 DIAGNOSIS — M85852 Other specified disorders of bone density and structure, left thigh: Secondary | ICD-10-CM | POA: Diagnosis not present

## 2023-02-09 DIAGNOSIS — Z780 Asymptomatic menopausal state: Secondary | ICD-10-CM | POA: Diagnosis not present

## 2023-02-09 DIAGNOSIS — M8580 Other specified disorders of bone density and structure, unspecified site: Secondary | ICD-10-CM

## 2023-03-31 DIAGNOSIS — E559 Vitamin D deficiency, unspecified: Secondary | ICD-10-CM | POA: Diagnosis not present

## 2023-03-31 DIAGNOSIS — Z0000 Encounter for general adult medical examination without abnormal findings: Secondary | ICD-10-CM | POA: Diagnosis not present

## 2023-03-31 DIAGNOSIS — F5101 Primary insomnia: Secondary | ICD-10-CM | POA: Diagnosis not present

## 2023-03-31 DIAGNOSIS — E785 Hyperlipidemia, unspecified: Secondary | ICD-10-CM | POA: Diagnosis not present

## 2023-03-31 DIAGNOSIS — I10 Essential (primary) hypertension: Secondary | ICD-10-CM | POA: Diagnosis not present

## 2023-03-31 DIAGNOSIS — Z1389 Encounter for screening for other disorder: Secondary | ICD-10-CM | POA: Diagnosis not present

## 2023-03-31 DIAGNOSIS — I739 Peripheral vascular disease, unspecified: Secondary | ICD-10-CM | POA: Diagnosis not present

## 2023-03-31 DIAGNOSIS — Z7902 Long term (current) use of antithrombotics/antiplatelets: Secondary | ICD-10-CM | POA: Diagnosis not present

## 2023-03-31 DIAGNOSIS — D509 Iron deficiency anemia, unspecified: Secondary | ICD-10-CM | POA: Diagnosis not present

## 2023-03-31 DIAGNOSIS — H35033 Hypertensive retinopathy, bilateral: Secondary | ICD-10-CM | POA: Diagnosis not present

## 2023-03-31 DIAGNOSIS — D696 Thrombocytopenia, unspecified: Secondary | ICD-10-CM | POA: Diagnosis not present

## 2023-03-31 DIAGNOSIS — E1159 Type 2 diabetes mellitus with other circulatory complications: Secondary | ICD-10-CM | POA: Diagnosis not present

## 2023-03-31 DIAGNOSIS — Z6824 Body mass index (BMI) 24.0-24.9, adult: Secondary | ICD-10-CM | POA: Diagnosis not present

## 2023-03-31 DIAGNOSIS — E2839 Other primary ovarian failure: Secondary | ICD-10-CM | POA: Diagnosis not present

## 2023-04-15 ENCOUNTER — Emergency Department (HOSPITAL_COMMUNITY)
Admission: EM | Admit: 2023-04-15 | Discharge: 2023-04-15 | Disposition: A | Discharge status: Home / Self Care | Payer: Medicare Other | Attending: Emergency Medicine | Admitting: Emergency Medicine

## 2023-04-15 ENCOUNTER — Other Ambulatory Visit: Payer: Self-pay

## 2023-04-15 ENCOUNTER — Encounter (HOSPITAL_COMMUNITY): Payer: Self-pay

## 2023-04-15 DIAGNOSIS — I10 Essential (primary) hypertension: Secondary | ICD-10-CM | POA: Insufficient documentation

## 2023-04-15 DIAGNOSIS — Z7982 Long term (current) use of aspirin: Secondary | ICD-10-CM | POA: Insufficient documentation

## 2023-04-15 DIAGNOSIS — Z79899 Other long term (current) drug therapy: Secondary | ICD-10-CM | POA: Diagnosis not present

## 2023-04-15 DIAGNOSIS — Z7984 Long term (current) use of oral hypoglycemic drugs: Secondary | ICD-10-CM | POA: Insufficient documentation

## 2023-04-15 DIAGNOSIS — R29810 Facial weakness: Secondary | ICD-10-CM | POA: Diagnosis present

## 2023-04-15 DIAGNOSIS — G510 Bell's palsy: Secondary | ICD-10-CM | POA: Insufficient documentation

## 2023-04-15 DIAGNOSIS — E119 Type 2 diabetes mellitus without complications: Secondary | ICD-10-CM | POA: Diagnosis not present

## 2023-04-15 MED ORDER — ARTIFICIAL TEARS OPHTHALMIC OINT: TOPICAL_OINTMENT | OPHTHALMIC | 0 refills | Status: DC | PRN

## 2023-04-15 MED ORDER — PREDNISONE 20 MG PO TABS: 40.0000 mg | ORAL_TABLET | Freq: Every day | ORAL | 0 refills | Status: AC

## 2023-04-15 MED ORDER — VALACYCLOVIR HCL 1 G PO TABS: 1000.0000 mg | ORAL_TABLET | Freq: Three times a day (TID) | ORAL | 0 refills | Status: DC

## 2023-04-15 NOTE — Discharge Instructions (Addendum)
Watch your sugars for the next few days.  The steroids make it more likely to go out of control.  Follow-up with your doctor.

## 2023-04-15 NOTE — ED Triage Notes (Addendum)
Pt arrives POV c/o L sided facial droop, numbness to L side of face, L sided blurry vision. Pt c/o pain behind L ear that started this afternoon. No unilateral weakness noted. Difficulty closing L eye. LNW 1700. Denies nasal congestion, sore throat, fevers/chills. A&Ox4.

## 2023-04-15 NOTE — ED Provider Notes (Signed)
EMERGENCY DEPARTMENT AT [facility] Provider Note   CSN: [id number] Arrival date & time: 04/15/23  1914     History  Chief Complaint  Patient presents with   Facial Droop    Diana Garcia is a 82 y.o. female.  HPI Patient presents with left-sided facial droop.  Began this afternoon.  No other numbness or weakness.  States her vision is mildly blurred on the left side.  States she did put some ointment in there however.  No numbness or weakness.  No real pain.  Feels as if it is hard to eat.  No numbness or weakness.  She is diabetic but sugars have been reassuring today.   Past Medical History:  Diagnosis Date   Closed fracture of fifth metatarsal bone    Diabetes mellitus without complication (HCC)    Hyperlipidemia    Hypertension    Insomnia    Insomnia     Home Medications Prior to Admission medications   Medication Sig Start Date End Date Taking? Authorizing Provider  artificial tears (LACRILUBE) OINT ophthalmic ointment Place into both eyes every 4 (four) hours as needed for dry eyes. 04/15/23  Yes Benjiman Core, MD  predniSONE (DELTASONE) 20 MG tablet Take 2 tablets (40 mg total) by mouth daily for 5 days. 04/15/23 04/20/23 Yes Benjiman Core, MD  valACYclovir (VALTREX) 1000 MG tablet Take 1 tablet (1,000 mg total) by mouth 3 (three) times daily. 04/15/23  Yes Benjiman Core, MD  amLODipine (NORVASC) 10 MG tablet Take 1 tablet by mouth  daily 03/20/14   Ileana Ladd, MD  aspirin EC 81 MG tablet Take 81 mg by mouth daily.    [provider]  Calcium Carbonate-Vitamin D (CALCIUM 600 + D PO) Take 1 tablet by mouth 3 (three) times a week. No specific days    [provider]  carvedilol (COREG) 3.125 MG tablet Take 1 tablet (3.125 mg total) by mouth 2 (two) times daily with a meal. 03/20/14   Ileana Ladd, MD  Cholecalciferol (VITAMIN D PO) Take 4,000 Units by mouth 3 (three) times a week. No specific days    [provider]  clobetasol cream (TEMOVATE) 0.05 % APPLY TOPICALLY TWICE DAILY 11/13/21   Brock Bad, MD  losartan (COZAAR) 25 MG tablet Take 1 tablet (25 mg total) by mouth daily. 03/20/14   Ileana Ladd, MD  metFORMIN (GLUCOPHAGE) 500 MG tablet Take 1 tablet by mouth  twice a day with meals 03/20/14   Ileana Ladd, MD  niacin 500 MG tablet Take 500 mg by mouth at bedtime.    [provider]  omeprazole (PRILOSEC) 20 MG capsule 1 capsule 30 minutes before morning meal    [provider]  rosuvastatin (CRESTOR) 20 MG tablet Take 20 mg by mouth daily.    [provider]  zolpidem (AMBIEN) 5 MG tablet Take 0.5 tablets by mouth 3 (three) times a week. 05/28/21   [provider]      Allergies    Lisinopril, Penicillins, and Pletal [cilostazol]    Review of Systems   Review of Systems  Physical Exam Updated Vital Signs BP (!) 167/76   Pulse 82   Temp 98.2 F (36.8 C)   Resp 17   SpO2 100%  Physical Exam Vitals and nursing note reviewed.  Eyes:     Extraocular Movements: Extraocular movements intact.     Comments: Visual fields grossly intact by confrontation  Cardiovascular:  Rate and Rhythm: Regular rhythm.  Pulmonary:     Breath sounds: No wheezing.  Abdominal:     Tenderness: There is no abdominal tenderness.  Musculoskeletal:     Cervical back: Neck supple.  Neurological:     Mental Status: She is alert and oriented to person, place, and time.     Comments: Left-sided facial droop.  Involves forehead.  Eye movements intact.  Sensation grossly intact.  Difficulty closing left eye     ED Results / Procedures / Treatments   Labs (all labs ordered are listed, but only abnormal results are displayed) Labs Reviewed - No data to display  EKG None  Radiology No results found.  Procedures Procedures    Medications Ordered in ED Medications - No data to display  ED Course/ Medical Decision Making/ A&P                              Medical Decision Making Risk Prescription drug management.  Patient with left-sided facial droop.  Forehead involved.  Differential diagnosis includes stroke but I think this is Bell's palsy.  With facial droop involving forehead stroke is much less likely.  No other neurodeficits.  Difficulty also closing the left eye.  Reviewed notes.  Is diabetic.  Sugars have been reassuring however.  Will treat with valacyclovir.  Left TM normal.  Also will have follow-up with PCP.  Will give steroids and Lacri-Lube.  Appears stable for discharge home does not appear to need further imaging at this time.         Final Clinical Impression(s) / ED Diagnoses Final diagnoses:  Left-sided Bell's palsy    Rx / DC Orders ED Discharge Orders          Ordered    artificial tears (LACRILUBE) OINT ophthalmic ointment  Every 4 hours PRN        04/15/23 1932    valACYclovir (VALTREX) 1000 MG tablet  3 times daily        04/15/23 1932    predniSONE (DELTASONE) 20 MG tablet  Daily        04/15/23 1932              Benjiman Core, MD 04/15/23 1938

## 2023-04-27 DIAGNOSIS — Z6823 Body mass index (BMI) 23.0-23.9, adult: Secondary | ICD-10-CM | POA: Diagnosis not present

## 2023-04-27 DIAGNOSIS — G510 Bell's palsy: Secondary | ICD-10-CM | POA: Diagnosis not present

## 2023-05-04 DIAGNOSIS — G510 Bell's palsy: Secondary | ICD-10-CM | POA: Diagnosis not present

## 2023-06-11 ENCOUNTER — Other Ambulatory Visit: Payer: Self-pay | Admitting: Family Medicine

## 2023-06-11 DIAGNOSIS — Z1231 Encounter for screening mammogram for malignant neoplasm of breast: Secondary | ICD-10-CM

## 2023-06-15 DIAGNOSIS — Z961 Presence of intraocular lens: Secondary | ICD-10-CM | POA: Diagnosis not present

## 2023-06-15 DIAGNOSIS — H353131 Nonexudative age-related macular degeneration, bilateral, early dry stage: Secondary | ICD-10-CM | POA: Diagnosis not present

## 2023-06-15 DIAGNOSIS — H35363 Drusen (degenerative) of macula, bilateral: Secondary | ICD-10-CM | POA: Diagnosis not present

## 2023-06-15 DIAGNOSIS — H35033 Hypertensive retinopathy, bilateral: Secondary | ICD-10-CM | POA: Diagnosis not present

## 2023-06-29 DIAGNOSIS — M79674 Pain in right toe(s): Secondary | ICD-10-CM | POA: Diagnosis not present

## 2023-06-29 DIAGNOSIS — E559 Vitamin D deficiency, unspecified: Secondary | ICD-10-CM | POA: Diagnosis not present

## 2023-06-29 DIAGNOSIS — I10 Essential (primary) hypertension: Secondary | ICD-10-CM | POA: Diagnosis not present

## 2023-06-30 ENCOUNTER — Ambulatory Visit
Admission: RE | Admit: 2023-06-30 | Discharge: 2023-06-30 | Disposition: A | Discharge status: Home / Self Care | Payer: Medicare Other | Source: Ambulatory Visit | Attending: Family Medicine | Admitting: Family Medicine

## 2023-06-30 DIAGNOSIS — Z1231 Encounter for screening mammogram for malignant neoplasm of breast: Secondary | ICD-10-CM | POA: Diagnosis not present

## 2023-08-24 DIAGNOSIS — Z23 Encounter for immunization: Secondary | ICD-10-CM | POA: Diagnosis not present

## 2023-09-20 ENCOUNTER — Ambulatory Visit: Payer: Self-pay | Admitting: Cardiology

## 2023-11-02 DIAGNOSIS — Z23 Encounter for immunization: Secondary | ICD-10-CM | POA: Diagnosis not present

## 2023-11-02 DIAGNOSIS — G4700 Insomnia, unspecified: Secondary | ICD-10-CM | POA: Diagnosis not present

## 2023-11-02 DIAGNOSIS — E559 Vitamin D deficiency, unspecified: Secondary | ICD-10-CM | POA: Diagnosis not present

## 2023-11-02 DIAGNOSIS — H02402 Unspecified ptosis of left eyelid: Secondary | ICD-10-CM | POA: Diagnosis not present

## 2023-11-02 DIAGNOSIS — E1159 Type 2 diabetes mellitus with other circulatory complications: Secondary | ICD-10-CM | POA: Diagnosis not present

## 2023-11-02 DIAGNOSIS — E785 Hyperlipidemia, unspecified: Secondary | ICD-10-CM | POA: Diagnosis not present

## 2023-11-02 DIAGNOSIS — I10 Essential (primary) hypertension: Secondary | ICD-10-CM | POA: Diagnosis not present

## 2023-11-25 NOTE — Progress Notes (Unsigned)
Cardiology Office Note:  .   Date:  11/27/2023  ID:  Diana Garcia, DOB 1941/05/11, MRN [id number] PCP: Jackelyn Poling, DO  [facility] Providers Cardiologist:  Yates Decamp, MD   History of Present Illness: .   Diana Garcia is a 82 y.o. African-American female with peripheral arterial disease and left SFA angioplasty in 2013, asymptomatic bilateral carotid artery stenosis, hypertension, DM and hyperlipidmia who presents here for annual follow up.     Discussed the use of AI scribe software for clinical note transcription with the patient, who gave verbal consent to proceed.  History of Present Illness   The patient, with a history of peripheral arterial disease, carotid arteriosclerosis, hypertension, hypercholesterolemia, and diabetes, presents for a routine follow-up. She reports overall well-being with no chest pain or shortness of breath. She continues to exercise regularly, with occasional right leg cramping when going uphill. She denies any vision problems. She is adherent to her medication regimen, including daily aspirin.       Review of Systems  Cardiovascular:  Positive for claudication. Negative for chest pain, dyspnea on exertion and leg swelling.    Labs   External Labs:  Labs 11/03/2023:  Vitamin D19.4.  A1c 6.4%.  Serum glucose 82 mg, BUN 27, creatinine 1.03, EGFR 54 mL, potassium 4.6.  LFTs normal.  Total cholesterol 139, triglycerides 52, HDL 51, LDL 76.  Labs 06/29/2023:  Vitamin D 32.1, A1c 6.4%.  Serum glucose 82 mg, BUN 27, creatinine 1.03, EGFR 54 mL, potassium 4.6, LFTs normal.  Hb 11.1/HCT 35.1, platelets 147, normal indicis.  Labs 11/03/2023:  Total cholesterol 139, triglycerides 52, HDL 51, LDL 76.  Non-HDL cholesterol 88.  Physical Exam:   VS:  BP 130/70 (BP Location: Left Arm, Patient Position: Sitting, Cuff Size: Normal)   Pulse 60   Ht 5\' 2"  (1.575 m)   Wt 127 lb (57.6 kg)   BMI 23.23 kg/m    Wt Readings from Last 3 Encounters:   11/26/23 127 lb (57.6 kg)  09/18/22 125 lb (56.7 kg)  09/19/21 131 lb 6.4 oz (59.6 kg)       11/26/2023    9:10 AM 04/15/2023    7:30 PM 04/15/2023    7:21 PM  Vitals with BMI  Height 5\' 2"     Weight 127 lbs    BMI 23.22    Systolic 130 137 952  Diastolic 70 66 76  Pulse 60 71 82    Physical Exam Neck:     Vascular: No carotid bruit or JVD.  Cardiovascular:     Rate and Rhythm: Normal rate and regular rhythm.     Pulses: Intact distal pulses.     Heart sounds: Normal heart sounds. No murmur heard.    No gallop.  Pulmonary:     Effort: Pulmonary effort is normal.     Breath sounds: Normal breath sounds.  Abdominal:     General: Bowel sounds are normal.     Palpations: Abdomen is soft.  Musculoskeletal:     Right lower leg: No edema.     Left lower leg: No edema.     Studies Reviewed: .    Carotid artery duplex 10/02/2022: Duplex suggests stenosis in the right internal carotid artery (50-69%). Duplex suggests stenosis in the left internal carotid artery (16-49%). Antegrade right vertebral artery flow. Antegrade left vertebral artery flow. There is homogenous plaque noted in the bilateral ICA. Compared to the study done on 09/29/2021, mild regression of right ICA stenosis from >/=  70%.  Otherwise stable carotid artery duplex. Follow up in six months is appropriate if clinically indicated.  Lower Extremity Arterial Duplex 10/02/2022: No hemodynamically significant stenoses are identified in the right lower extremity arterial system. No hemodynamically significant stenoses are identified in the left lower extremity arterial system. Left mid superficial femoral artery balloon angioplasty noted is patent.  This exam reveals normal perfusion of the right lower extremity (ABI 0.98).   This exam reveals normal perfusion of the left lower extremity (ABI 0.98).  Normal triphasic waveform pattern at the level of ankles.  No significant change from 12/12/2013.  EKG:    EKG  Interpretation Date/Time:  Friday November 26 2023 09:03:43 EST Ventricular Rate:  56 PR Interval:  172 QRS Duration:  78 QT Interval:  410 QTC Calculation: 395 R Axis:   9  Text Interpretation: EKG 11/26/2023: Normal sinus rhythm at rate of 56 bpm, normal EKG. Confirmed by Delrae Rend [phone]) on 11/26/2023 9:19:26 AM    EKG 09/18/2022: Sinus rhythm at rate of 65 bpm. Normal axis. No evidence of ischemia or underlying injury pattern.   Medications and allergies    Allergies  Allergen Reactions   Lisinopril     fainting   Penicillins Hives   Plavix [Clopidogrel]     Low hgb   Pletal [Cilostazol] Palpitations     Current Outpatient Medications:    amLODipine (NORVASC) 10 MG tablet, Take 1 tablet by mouth  daily, Disp: 90 tablet, Rfl: 3   artificial tears (LACRILUBE) OINT ophthalmic ointment, Place into both eyes every 4 (four) hours as needed for dry eyes., Disp: 3.5 g, Rfl: 0   aspirin EC 81 MG tablet, Take 81 mg by mouth every evening. 30 min before Niacin, Disp: , Rfl:    Calcium Carbonate-Vitamin D (CALCIUM 600 + D PO), Take 1 tablet by mouth 3 (three) times a week. No specific days, Disp: , Rfl:    carvedilol (COREG) 3.125 MG tablet, Take 1 tablet (3.125 mg total) by mouth 2 (two) times daily with a meal., Disp: 180 tablet, Rfl: 3   Cholecalciferol (VITAMIN D PO), Take 4,000 Units by mouth 3 (three) times a week. No specific days, Disp: , Rfl:    clobetasol cream (TEMOVATE) 0.05 %, APPLY TOPICALLY TWICE DAILY, Disp: 30 g, Rfl: 1   losartan (COZAAR) 25 MG tablet, Take 1 tablet (25 mg total) by mouth daily., Disp: 90 tablet, Rfl: 3   metFORMIN (GLUCOPHAGE) 500 MG tablet, Take 1 tablet by mouth  twice a day with meals, Disp: 180 tablet, Rfl: 3   niacin 500 MG tablet, Take 500 mg by mouth at bedtime., Disp: , Rfl:    omeprazole (PRILOSEC) 20 MG capsule, 1 capsule 30 minutes before morning meal, Disp: , Rfl:    rosuvastatin (CRESTOR) 20 MG tablet, Take 20 mg by mouth daily.,  Disp: , Rfl:    valACYclovir (VALTREX) 1000 MG tablet, Take 1 tablet (1,000 mg total) by mouth 3 (three) times daily., Disp: 21 tablet, Rfl: 0   ASSESSMENT AND PLAN: .      ICD-10-CM   1. Peripheral artery disease (HCC)  I73.9 EKG 12-Lead    2. Asymptomatic bilateral carotid artery stenosis  I65.23 VAS US CAROTID    3. Pure hypercholesterolemia  E78.00     4. Essential hypertension  I10       Assessment and Plan    Carotid Arteriosclerosis No audible bruit on examination, suggesting possible improvement. Currently on Rosuvastatin 20mg  and Niacin 500mg  (flush  free) for cholesterol control. -Order Carotid Artery Duplex to assess current status of carotid disease. -Change Niacin to regular (non-flush free) type to potentially improve cholesterol control. Take Aspirin 30 minutes prior to Niacin to reduce flushing.  Hypercholesterolemia LDL slightly above target, currently on Rosuvastatin 20mg  and Niacin 500mg  (flush free). -Change Niacin to regular (non-flush free) type and take Aspirin 30 minutes prior to Niacin to reduce flushing.  Hypertension Well controlled on Carvedilol 3.125mg  BID and Losartan. -Continue current regimen.  Diabetes Mellitus Well controlled (A1C 6.4%) on Metformin. -Continue current regimen.  Peripheral Arterial Disease No current symptoms, good pulses on examination. -Continue Aspirin 81mg  daily.  Follow-up in 1 year.      Signed,  Yates Decamp, MD, [facility] 11/27/2023, 4:51 AM [facility] [address] [place], [place] [address] Phone: [phone]. Fax:  [phone]

## 2023-11-26 ENCOUNTER — Ambulatory Visit: Payer: Medicare Other | Attending: Cardiology | Admitting: Cardiology

## 2023-11-26 ENCOUNTER — Encounter: Payer: Self-pay | Admitting: Cardiology

## 2023-11-26 VITALS — BP 130/70 | HR 60 | Ht 62.0 in | Wt 127.0 lb

## 2023-11-26 DIAGNOSIS — I6523 Occlusion and stenosis of bilateral carotid arteries: Secondary | ICD-10-CM | POA: Diagnosis not present

## 2023-11-26 DIAGNOSIS — I739 Peripheral vascular disease, unspecified: Secondary | ICD-10-CM | POA: Diagnosis not present

## 2023-11-26 DIAGNOSIS — E7800 Pure hypercholesterolemia, unspecified: Secondary | ICD-10-CM | POA: Insufficient documentation

## 2023-11-26 DIAGNOSIS — I10 Essential (primary) hypertension: Secondary | ICD-10-CM | POA: Insufficient documentation

## 2023-11-26 NOTE — Patient Instructions (Signed)
Testing/Procedures: Carotid ultrasounds Your physician has requested that you have a carotid duplex. This test is an ultrasound of the carotid arteries in your neck. It looks at blood flow through these arteries that supply the brain with blood. Allow one hour for this exam. There are no restrictions or special instructions.  Follow-Up: At [facility], you and your health needs are our priority.  As part of our continuing mission to provide you with exceptional heart care, we have created designated Provider Care Teams.  These Care Teams include your primary Cardiologist (physician) and Advanced Practice Providers (APPs -  Physician Assistants and Nurse Practitioners) who all work together to provide you with the care you need, when you need it.  We recommend signing up for the patient portal called "MyChart".  Sign up information is provided on this After Visit Summary.  MyChart is used to connect with patients for Virtual Visits (Telemedicine).  Patients are able to view lab/test results, encounter notes, upcoming appointments, etc.  Non-urgent messages can be sent to your provider as well.   To learn more about what you can do with MyChart, go to [email or web address].    Your next appointment:   1 year(s)  Provider:   Yates Decamp, MD

## 2023-12-27 ENCOUNTER — Ambulatory Visit (HOSPITAL_COMMUNITY)
Admission: RE | Admit: 2023-12-27 | Discharge: 2023-12-27 | Disposition: A | Discharge status: Home / Self Care | Payer: Medicare Other | Source: Ambulatory Visit | Attending: Cardiology | Admitting: Cardiology

## 2023-12-27 DIAGNOSIS — I6523 Occlusion and stenosis of bilateral carotid arteries: Secondary | ICD-10-CM | POA: Insufficient documentation

## 2023-12-27 NOTE — Progress Notes (Signed)
No significant change from 10/02/2022. Recheck in 1 year prior to next OV. Right moderate disease around 60-70% and no indication for surgery or stenting.   Carotid artery duplex : Indication bilateral carotid stensois

## 2023-12-29 ENCOUNTER — Other Ambulatory Visit: Payer: Self-pay | Admitting: *Deleted

## 2023-12-29 DIAGNOSIS — I6523 Occlusion and stenosis of bilateral carotid arteries: Secondary | ICD-10-CM

## 2024-01-24 DIAGNOSIS — H353131 Nonexudative age-related macular degeneration, bilateral, early dry stage: Secondary | ICD-10-CM | POA: Diagnosis not present

## 2024-04-07 DIAGNOSIS — Z0000 Encounter for general adult medical examination without abnormal findings: Secondary | ICD-10-CM | POA: Diagnosis not present

## 2024-04-07 DIAGNOSIS — R21 Rash and other nonspecific skin eruption: Secondary | ICD-10-CM | POA: Diagnosis not present

## 2024-04-07 DIAGNOSIS — Z6824 Body mass index (BMI) 24.0-24.9, adult: Secondary | ICD-10-CM | POA: Diagnosis not present

## 2024-04-07 DIAGNOSIS — Z1331 Encounter for screening for depression: Secondary | ICD-10-CM | POA: Diagnosis not present

## 2024-04-20 DIAGNOSIS — E785 Hyperlipidemia, unspecified: Secondary | ICD-10-CM | POA: Diagnosis not present

## 2024-04-20 DIAGNOSIS — D509 Iron deficiency anemia, unspecified: Secondary | ICD-10-CM | POA: Diagnosis not present

## 2024-04-20 DIAGNOSIS — E559 Vitamin D deficiency, unspecified: Secondary | ICD-10-CM | POA: Diagnosis not present

## 2024-04-20 DIAGNOSIS — E1159 Type 2 diabetes mellitus with other circulatory complications: Secondary | ICD-10-CM | POA: Diagnosis not present

## 2024-04-20 DIAGNOSIS — I10 Essential (primary) hypertension: Secondary | ICD-10-CM | POA: Diagnosis not present

## 2024-05-23 ENCOUNTER — Other Ambulatory Visit: Payer: Self-pay | Admitting: Family Medicine

## 2024-05-23 DIAGNOSIS — Z1231 Encounter for screening mammogram for malignant neoplasm of breast: Secondary | ICD-10-CM

## 2024-06-30 ENCOUNTER — Ambulatory Visit
Admission: RE | Admit: 2024-06-30 | Discharge: 2024-06-30 | Disposition: A | Discharge status: Home / Self Care | Source: Ambulatory Visit | Attending: Family Medicine | Admitting: Family Medicine

## 2024-06-30 DIAGNOSIS — Z1231 Encounter for screening mammogram for malignant neoplasm of breast: Secondary | ICD-10-CM

## 2024-07-27 DIAGNOSIS — H353131 Nonexudative age-related macular degeneration, bilateral, early dry stage: Secondary | ICD-10-CM | POA: Diagnosis not present

## 2024-07-27 DIAGNOSIS — E119 Type 2 diabetes mellitus without complications: Secondary | ICD-10-CM | POA: Diagnosis not present

## 2024-08-28 DIAGNOSIS — Z01419 Encounter for gynecological examination (general) (routine) without abnormal findings: Secondary | ICD-10-CM | POA: Diagnosis not present

## 2024-08-28 DIAGNOSIS — E119 Type 2 diabetes mellitus without complications: Secondary | ICD-10-CM | POA: Diagnosis not present

## 2024-08-28 DIAGNOSIS — E7800 Pure hypercholesterolemia, unspecified: Secondary | ICD-10-CM | POA: Diagnosis not present

## 2024-08-28 DIAGNOSIS — M8580 Other specified disorders of bone density and structure, unspecified site: Secondary | ICD-10-CM | POA: Diagnosis not present

## 2024-08-28 DIAGNOSIS — I10 Essential (primary) hypertension: Secondary | ICD-10-CM | POA: Diagnosis not present

## 2024-11-02 DIAGNOSIS — D509 Iron deficiency anemia, unspecified: Secondary | ICD-10-CM | POA: Diagnosis not present

## 2024-11-02 DIAGNOSIS — E785 Hyperlipidemia, unspecified: Secondary | ICD-10-CM | POA: Diagnosis not present

## 2024-11-02 DIAGNOSIS — E1159 Type 2 diabetes mellitus with other circulatory complications: Secondary | ICD-10-CM | POA: Diagnosis not present

## 2024-11-02 DIAGNOSIS — I10 Essential (primary) hypertension: Secondary | ICD-10-CM | POA: Diagnosis not present

## 2024-11-02 DIAGNOSIS — Z23 Encounter for immunization: Secondary | ICD-10-CM | POA: Diagnosis not present

## 2024-11-02 DIAGNOSIS — E559 Vitamin D deficiency, unspecified: Secondary | ICD-10-CM | POA: Diagnosis not present

## 2024-11-02 DIAGNOSIS — I739 Peripheral vascular disease, unspecified: Secondary | ICD-10-CM | POA: Diagnosis not present

## 2024-11-07 ENCOUNTER — Encounter (HOSPITAL_COMMUNITY): Payer: Self-pay

## 2024-12-12 ENCOUNTER — Ambulatory Visit: Attending: Cardiology | Admitting: Cardiology

## 2024-12-12 ENCOUNTER — Encounter: Payer: Self-pay | Admitting: Cardiology

## 2024-12-12 VITALS — BP 121/72 | HR 64 | Ht 61.0 in | Wt 133.2 lb

## 2024-12-12 DIAGNOSIS — I739 Peripheral vascular disease, unspecified: Secondary | ICD-10-CM | POA: Insufficient documentation

## 2024-12-12 DIAGNOSIS — I6523 Occlusion and stenosis of bilateral carotid arteries: Secondary | ICD-10-CM | POA: Insufficient documentation

## 2024-12-12 DIAGNOSIS — I10 Essential (primary) hypertension: Secondary | ICD-10-CM | POA: Diagnosis not present

## 2024-12-12 DIAGNOSIS — E7800 Pure hypercholesterolemia, unspecified: Secondary | ICD-10-CM | POA: Diagnosis not present

## 2024-12-12 NOTE — Patient Instructions (Signed)
 Medication Instructions:  Your physician recommends that you continue on your current medications as directed. Please refer to the Current Medication list given to you today.  *If you need a refill on your cardiac medications before your next appointment, please call your pharmacy*   Testing/Procedures: Carotid Artery Duplex  Your physician has requested that you have a carotid duplex. This test is an ultrasound of the carotid arteries in your neck. It looks at blood flow through these arteries that supply the brain with blood. Allow one hour for this exam. There are no restrictions or special instructions.     Follow-Up: At [facility], you and your health needs are our priority.  As part of our continuing mission to provide you with exceptional heart care, our providers are all part of one team.  This team includes your primary Cardiologist (physician) and Advanced Practice Providers or APPs (Physician Assistants and Nurse Practitioners) who all work together to provide you with the care you need, when you need it.  Your next appointment:   1 year(s)  Provider:   Gordy Bergamo, MD            We recommend signing up for the patient portal called MyChart.  Patients are able to view lab/test results, encounter notes, upcoming appointments, etc.  Non-urgent messages can be sent to your provider as well, go to [email or web address].

## 2024-12-12 NOTE — Progress Notes (Signed)
 " Cardiology Office Note:  .   Date:  12/12/2024  ID:  Diana Garcia, DOB 1940-12-19, MRN [id number] PCP: Dayna Motto, DO  [facility] Providers Cardiologist:  Gordy Bergamo, MD   History of Present Illness: .   Diana Garcia is a 84 y.o. African-American female with peripheral arterial disease and left SFA angioplasty in 2013, asymptomatic bilateral carotid artery stenosis, hypertension, DM and hyperlipidmia who presents here for annual follow up.  She has had no further claudication symptoms and normal ABI on 10/02/2022, has moderate bilateral carotid artery stenosis and underwent carotid artery surveillance duplex and presents for follow-up.  Since risk factors are modified, she has done well from vascular standpoint and cardiac standpoint and remains asymptomatic.    Discussed the use of AI scribe software for clinical note transcription with the patient, who gave verbal consent to proceed.  History of Present Illness Diana Garcia is an 84 year old female with carotid artery stenosis and well-controlled diabetes who presents for a cardiovascular follow-up.  She has no new concerns and is doing well without chest pain, shortness of breath, or leg pain.  Her past medical history is notable for carotid artery stenosis and diabetes, both currently stable.  Her medications include aspirin  daily, carvedilol  3.125 mg twice a day, losartan  25 mg once a day, rosuvastatin 20 mg once a day, amlodipine  10 mg once a day, and metformin  500 mg twice a day for well-controlled diabetes.  She lives alone but has regular support from her son, who visits monthly.  Cardiac Studies relevent.     Carotid artery duplex 12/27/2023:   Right ICA 60 to 79% stenosis Left ICA 40 to 59% stenosis Bilateral antegrade vertebral artery flow.  Subclavian hemodynamics are normal.  Follow-up 12 months.  EKG:   EKG Interpretation Date/Time:  Tuesday December 12 2024 08:42:12 EST Ventricular Rate:  70 PR  Interval:  166 QRS Duration:  72 QT Interval:  390 QTC Calculation: 421 R Axis:   26  Text Interpretation: EKG 12/12/2024: Normal sinus rhythm at rate of 70 bpm, low-voltage complexes otherwise normal EKG.  Compared to 11/26/2023, no significant change. Confirmed by Sherie Dobrowolski, Jagadeesh [phone]) on 12/12/2024 8:50:29 AM  Labs   Care everywhere/Faxed External Labs:  Labs 11/02/2024:  Total cholesterol 141, triglycerides 101, HDL 51, LDL 72.  Vitamin D 32.  Serum glucose 92 mg, BUN 23, creatinine 0.81, eGFR 72 mL, potassium 4.3, LFTs normal.  Hb 12.0/HCT 36.3, platelets 167, normal indicis.  A1c 6.6%.  ROS  Review of Systems  Cardiovascular:  Negative for chest pain, dyspnea on exertion and leg swelling.   Physical Exam:   VS:  BP 121/72 (BP Location: Left Arm, Patient Position: Sitting, Cuff Size: Normal)   Pulse 64   Ht 5' 1 (1.549 m)   Wt 133 lb 3.2 oz (60.4 kg)   SpO2 100%   BMI 25.17 kg/m    Wt Readings from Last 3 Encounters:  12/12/24 133 lb 3.2 oz (60.4 kg)  11/26/23 127 lb (57.6 kg)  09/18/22 125 lb (56.7 kg)    BP Readings from Last 3 Encounters:  12/12/24 121/72  11/26/23 130/70  04/15/23 137/66   Physical Exam Neck:     Vascular: No JVD.  Cardiovascular:     Rate and Rhythm: Normal rate and regular rhythm.     Pulses: Intact distal pulses.          Carotid pulses are  on the right side with bruit.  Dorsalis pedis pulses are 2+ on the right side and 2+ on the left side.     Heart sounds: S1 normal and S2 normal. Murmur heard.     Early systolic murmur is present with a grade of 2/6 at the upper right sternal border.     No gallop.  Pulmonary:     Effort: Pulmonary effort is normal.     Breath sounds: Normal breath sounds.  Abdominal:     General: Bowel sounds are normal.     Palpations: Abdomen is soft.  Musculoskeletal:     Right lower leg: No edema.     Left lower leg: No edema.     ASSESSMENT AND PLAN: .      ICD-10-CM   1. Asymptomatic  bilateral carotid artery stenosis  I65.23 EKG 12-Lead    2. Peripheral artery disease  I73.9 EKG 12-Lead    3. Pure hypercholesterolemia  E78.00 EKG 12-Lead    4. Essential hypertension  I10 EKG 12-Lead     Assessment & Plan Asymptomatic bilateral carotid artery stenosis Bilateral carotid artery stenosis with right side 60-79% and left side 40-59% stenosis, well-managed compared to last year. - Will recheck carotid artery duplex in one year.  Essential hypertension Blood pressure is well-controlled with current medication regimen. - Continue carvedilol  3.125 mg twice a day. - Continue losartan  25 mg once a day. - Continue amlodipine  10 mg once a day.  Pure hypercholesterolemia Cholesterol levels are well-controlled with current medication regimen. HDL is 51, LDL is 72. - Continue rosuvastatin 20 mg once a day.  Type 2 diabetes mellitus Diabetes is well-controlled with current medication regimen. - Continue metformin  500 mg twice a day.  Follow up: 1 Year Carotid stenosis F/U duplex, Hypertension and PAD   Signed,  Gordy Bergamo, MD, [facility] 12/12/2024, 9:01 AM [facility] [address] [place], [place] [address] Phone: [phone]. Fax:  [phone]  "

## 2025-01-25 ENCOUNTER — Ambulatory Visit (HOSPITAL_COMMUNITY)
